# Patient Record
Sex: Female | Born: 1985 | Race: Black or African American | Hispanic: No | Marital: Single | Smoking: Never smoker
Health system: Southern US, Community
[De-identification: ages and names within clinical notes are randomized; demographics above are authoritative.]

---

## 2007-10-27 ENCOUNTER — Inpatient Hospital Stay (HOSPITAL_COMMUNITY): Admission: AD | Admit: 2007-10-27 | Discharge: 2007-11-01 | Payer: Self-pay | Admitting: Obstetrics and Gynecology

## 2007-10-29 ENCOUNTER — Encounter (INDEPENDENT_AMBULATORY_CARE_PROVIDER_SITE_OTHER): Payer: Self-pay | Admitting: Obstetrics and Gynecology

## 2010-06-20 NOTE — Op Note (Signed)
NAME:  Holly Mitchell, Holly Mitchell          ACCOUNT NO.:  [id number]   MEDICAL RECORD NO.:  [id number]          PATIENT TYPE:  INP   LOCATION:  9119                          FACILITY:  [facility]   PHYSICIAN:  Maxie Better, M.D.DATE OF BIRTH:  [birth year]   DATE OF PROCEDURE:  10/29/2007  DATE OF DISCHARGE:                               OPERATIVE REPORT   PREOPERATIVE DIAGNOSES:  1. Nonreassuring fetal tracing.  2. Arrest of descent.   PROCEDURE:  Primary cesarean section, Kerr  hysterotomy.   POSTOPERATIVE DIAGNOSES:  1. Nonreassuring fetal tracing.  2. Arrest of descent.   ANESTHESIA:  Epidural.   SURGEON:  Maxie Better, MD   ASSISTANT:  Lendon Colonel, MD   PROCEDURE:  Under adequate epidural anesthesia, the patient was placed  in the supine position with left lateral tilt.  An indwelling Foley  catheter was sterilely placed.  0.25% Marcaine was injected along the  planned Pfannenstiel skin incision site.  Pfannenstiel skin incision was  then made, carried down to the rectus fascia.  Rectus fascia was opened  transversely.  The rectus fascia was then bluntly and sharply dissected  off the rectus muscle in superior-to-inferior fashion.  Rectus muscles  split in the midline.  The parietal peritoneum was entered bluntly and  extended.  The vesicouterine peritoneum was opened transversely.  The  bladder was distended.  The bladder flap was then bluntly dissected off  the lower uterine segment, displaced inferiorly.  Curvilinear low-  transverse uterine incision was then made and extended with bandage  scissors.  Subsequent delivery with the assistance of the nurse pushing  the vertex up of a live female was accomplished.  Baby was bulb suctioned.  The abdomen cord was clamped and cut.  The baby was transferred to the  awaiting pediatrician who assigned Apgars of 8 and 9 at 1 and 5 minutes.  Placenta was manually removed.  Uterine cavity was cleaned of debris.  Uterine incision  had small extension on the right inferior about 2 cm.  This was closed separately with 0 Monocryl suture.  The remaining  incision was closed with 0 Monocryl in running-locked stitch.  Second  layer was imbricated using 0 Monocryl suture.  A reinforced figure-of-  eight suture was placed along the extension site which had noted to have  some bleeding.  Both tubes and ovaries were noted to be normal.  The  abdomen was copiously irrigated, suctioned of debris, and good  hemostasis noted.  The parietal peritoneum was closed with 2-0 Vicryl.  The rectus fascia was closed with 0 Vicryl x2.  The subcutaneous area  was irrigated, small bleeders cauterized.  Interrupted 2-0 plain suture  was placed.  Skin approximated using Ethicon staples.   SPECIMENS:  Placenta sent to pathology.   ESTIMATED BLOOD LOSS:  800 mL.   INTRAOPERATIVE FLUID:  1200 mL.   URINE OUTPUT:  300 mL clearing.   COUNTS:  Sponge and instrument counts x2.   COMPLICATIONS:  None.   Weight of the baby was 7 pounds 7 ounces.  Cord pH was 7.34.  The  patient tolerated the procedure well and was  transferred to the recovery  room in stable condition.      Maxie Better, M.D.  Electronically Signed     [place]/MEDQ  D:  10/29/2007  T:  10/30/2007  Job:  [id number]

## 2010-06-23 NOTE — Discharge Summary (Signed)
NAME:  Holly Mitchell, Holly Mitchell             ACCOUNT NO.:  [id number]   MEDICAL RECORD NO.:  [id number]          PATIENT TYPE:  INP   LOCATION:  9119                          FACILITY:  [facility]   PHYSICIAN:  Maxie Better, M.D.DATE OF BIRTH:  01-Jan-1986   DATE OF ADMISSION:  10/27/2007  DATE OF DISCHARGE:  11/01/2007                               DISCHARGE SUMMARY   ADMISSION DIAGNOSIS:  Postdates.   DISCHARGE DIAGNOSES:  Postdates delivered, nonreassuring fetal tracing,  arrest of descent.   PROCEDURE:  1. A primary cesarean section,  Kerr hysterotomy   HISTORY OF PRESENT ILLNESS:  A 25 year old, gravida 1, para 0, at 62+  weeks' gestation admitted for induction of labor.   HOSPITAL COURSE:  The patient was admitted.  She was long, closed, and  high.  She was given Cervidil ripening followed by Cytotec.  She  subsequently had cervical dilatation, type 2, 100% minus one.  Artificial rupture of membranes was performed at that time.  Clear fluid  was noted.  Epidural was given.  The patient subsequently had internal  scalp electrode and intrauterine pressure catheter placement.  The  patient subsequently had some variable deceleration where amnioinfusion  was ordered.  She then progressed to full dilatation, +1 station,  started pushing; however, the patient on re-examination was actually a  rim, +1 station, the pushing was stopped.  Contractions were 3-6  minutes.  Pitocin which had been discontinued during this process was  restarted.  Attempted labor in the vertex down.  The patient  subsequently started to push again.  She had repetitive late  deceleration with contractions and pushing and with a narrow pelvic arch  in the vertex still at +1 station.  The patient was not a candidate for  an assisted vaginal delivery.  She was taken to the operating room for  primary cesarean section.  At the time of cesarean section, she  delivered a live female, left occiput transverse position, Apgars  of 8 and  9, cord pH was 7.34, weight of baby was 7 pounds 7 ounces.  The patient  had an uncomplicated postoperative course.  Her CBC on postop day #1  showed a hemoglobin of 9.8, hematocrit 29.9, white count of 16.8,  platelet count of 174,000.  The patient has good parental support.  Incision showed no evidence of infection.  She was deemed well to be  discharged home.   DISPOSITION:  Home.   CONDITION:  Stable.   DISCHARGE MEDICATIONS:  1. Colace 1 tablet per mouth twice a day.  2. Percocet 5/325 mg 1-2 tablets every 4-6 hours p.r.n. pain.  3. Motrin 600 mg every 6 hours p.r.n. pain.  4. Prenatal vitamins 1 p.o. daily.   Followup appointment at [facility] in 6 weeks.   Discharge instructions per the postpartum booklet given.       Maxie Better, M.D.  Electronically Signed     [place]/MEDQ  D:  12/16/2007  T:  12/17/2007  Job:  [id number]

## 2010-11-06 LAB — CBC
HCT: 29.9 — ABNORMAL LOW
HCT: 35.2 — ABNORMAL LOW
Hemoglobin: 9.8 — ABNORMAL LOW
MCHC: 32.7
MCV: 86.3
RBC: 4.08
RDW: 14.4
WBC: 10

## 2018-07-11 LAB — OB RESULTS CONSOLE ABO/RH: RH Type: POSITIVE

## 2018-07-11 LAB — OB RESULTS CONSOLE RPR: RPR: NONREACTIVE

## 2018-07-11 LAB — OB RESULTS CONSOLE HIV ANTIBODY (ROUTINE TESTING): HIV: NONREACTIVE

## 2018-07-11 LAB — OB RESULTS CONSOLE RUBELLA ANTIBODY, IGM: Rubella: IMMUNE

## 2018-07-11 LAB — OB RESULTS CONSOLE HEPATITIS B SURFACE ANTIGEN: Hepatitis B Surface Ag: NEGATIVE

## 2019-01-09 ENCOUNTER — Inpatient Hospital Stay (HOSPITAL_COMMUNITY)
Admission: AD | Admit: 2019-01-09 | Discharge: 2019-01-17 | DRG: 788 | Disposition: A | Discharge status: Home / Self Care | Payer: BC Managed Care – PPO | Attending: Obstetrics and Gynecology | Admitting: Obstetrics and Gynecology

## 2019-01-09 ENCOUNTER — Encounter (HOSPITAL_COMMUNITY): Payer: Self-pay

## 2019-01-09 ENCOUNTER — Other Ambulatory Visit: Payer: Self-pay

## 2019-01-09 DIAGNOSIS — O114 Pre-existing hypertension with pre-eclampsia, complicating childbirth: Secondary | ICD-10-CM | POA: Diagnosis present

## 2019-01-09 DIAGNOSIS — O34219 Maternal care for unspecified type scar from previous cesarean delivery: Secondary | ICD-10-CM | POA: Diagnosis not present

## 2019-01-09 DIAGNOSIS — O321XX0 Maternal care for breech presentation, not applicable or unspecified: Secondary | ICD-10-CM | POA: Diagnosis present

## 2019-01-09 DIAGNOSIS — Z98891 History of uterine scar from previous surgery: Secondary | ICD-10-CM

## 2019-01-09 DIAGNOSIS — O1002 Pre-existing essential hypertension complicating childbirth: Secondary | ICD-10-CM | POA: Diagnosis present

## 2019-01-09 DIAGNOSIS — O34211 Maternal care for low transverse scar from previous cesarean delivery: Secondary | ICD-10-CM | POA: Diagnosis present

## 2019-01-09 DIAGNOSIS — O133 Gestational [pregnancy-induced] hypertension without significant proteinuria, third trimester: Secondary | ICD-10-CM | POA: Diagnosis present

## 2019-01-09 DIAGNOSIS — Z20828 Contact with and (suspected) exposure to other viral communicable diseases: Secondary | ICD-10-CM | POA: Diagnosis present

## 2019-01-09 DIAGNOSIS — Z363 Encounter for antenatal screening for malformations: Secondary | ICD-10-CM | POA: Diagnosis not present

## 2019-01-09 DIAGNOSIS — O169 Unspecified maternal hypertension, unspecified trimester: Secondary | ICD-10-CM

## 2019-01-09 DIAGNOSIS — O3413 Maternal care for benign tumor of corpus uteri, third trimester: Secondary | ICD-10-CM | POA: Diagnosis not present

## 2019-01-09 DIAGNOSIS — Z3A35 35 weeks gestation of pregnancy: Secondary | ICD-10-CM | POA: Diagnosis not present

## 2019-01-09 DIAGNOSIS — O10013 Pre-existing essential hypertension complicating pregnancy, third trimester: Secondary | ICD-10-CM | POA: Diagnosis not present

## 2019-01-09 LAB — CBC WITH DIFFERENTIAL/PLATELET
Abs Immature Granulocytes: 0.05 10*3/uL (ref 0.00–0.07)
Basophils Absolute: 0 10*3/uL (ref 0.0–0.1)
Basophils Relative: 0 %
Eosinophils Absolute: 0.1 10*3/uL (ref 0.0–0.5)
Eosinophils Relative: 1 %
HCT: 42.1 % (ref 36.0–46.0)
Hemoglobin: 14.3 g/dL (ref 12.0–15.0)
Immature Granulocytes: 1 %
Lymphocytes Relative: 16 %
Lymphs Abs: 1.8 10*3/uL (ref 0.7–4.0)
MCH: 32.1 pg (ref 26.0–34.0)
MCHC: 34 g/dL (ref 30.0–36.0)
MCV: 94.6 fL (ref 80.0–100.0)
Monocytes Absolute: 0.8 10*3/uL (ref 0.1–1.0)
Monocytes Relative: 7 %
Neutro Abs: 8.4 10*3/uL — ABNORMAL HIGH (ref 1.7–7.7)
Neutrophils Relative %: 75 %
Platelets: 184 10*3/uL (ref 150–400)
RBC: 4.45 MIL/uL (ref 3.87–5.11)
RDW: 13.4 % (ref 11.5–15.5)
WBC: 11.1 10*3/uL — ABNORMAL HIGH (ref 4.0–10.5)
nRBC: 0 % (ref 0.0–0.2)

## 2019-01-09 LAB — COMPREHENSIVE METABOLIC PANEL
ALT: 26 U/L (ref 0–44)
AST: 26 U/L (ref 15–41)
Albumin: 2.9 g/dL — ABNORMAL LOW (ref 3.5–5.0)
Alkaline Phosphatase: 132 U/L — ABNORMAL HIGH (ref 38–126)
Anion gap: 9 (ref 5–15)
BUN: 7 mg/dL (ref 6–20)
CO2: 21 mmol/L — ABNORMAL LOW (ref 22–32)
Calcium: 9.3 mg/dL (ref 8.9–10.3)
Chloride: 107 mmol/L (ref 98–111)
Creatinine, Ser: 0.5 mg/dL (ref 0.44–1.00)
GFR calc Af Amer: >60 mL/min (ref >60)
GFR calc non Af Amer: >60 mL/min (ref >60)
Glucose, Bld: 73 mg/dL (ref 70–99)
Potassium: 4.6 mmol/L (ref 3.5–5.1)
Sodium: 137 mmol/L (ref 135–145)
Total Bilirubin: 0.2 mg/dL — ABNORMAL LOW (ref 0.3–1.2)
Total Protein: 5.6 g/dL — ABNORMAL LOW (ref 6.5–8.1)

## 2019-01-09 LAB — TYPE AND SCREEN
ABO/RH(D): B POS
Antibody Screen: NEGATIVE

## 2019-01-09 LAB — PROTEIN / CREATININE RATIO, URINE
Creatinine, Urine: 152.92 mg/dL
Protein Creatinine Ratio: 0.12 mg/mg{Cre} (ref 0.00–0.15)
Total Protein, Urine: 19 mg/dL

## 2019-01-09 LAB — GROUP B STREP BY PCR: Group B strep by PCR: NEGATIVE

## 2019-01-09 LAB — ABO/RH: ABO/RH(D): B POS

## 2019-01-09 LAB — SARS CORONAVIRUS 2 (TAT 6-24 HRS): SARS Coronavirus 2: NEGATIVE

## 2019-01-09 MED ORDER — LACTATED RINGERS IV SOLN
INTRAVENOUS | Status: DC
  Administered 2019-01-10: 06:00:00 via INTRAVENOUS

## 2019-01-09 MED ORDER — LABETALOL HCL 5 MG/ML IV SOLN
20.0000 mg | INTRAVENOUS | Status: DC | PRN
  Administered 2019-01-09 – 2019-01-13 (×2): 20 mg via INTRAVENOUS
  Filled 2019-01-09 (×2): qty 4

## 2019-01-09 MED ORDER — MAGNESIUM SULFATE 40 GM/1000ML IV SOLN
2.0000 g/h | INTRAVENOUS | Status: AC
  Administered 2019-01-09 – 2019-01-10 (×2): 2 g/h via INTRAVENOUS
  Filled 2019-01-09 (×2): qty 1000

## 2019-01-09 MED ORDER — BETAMETHASONE SOD PHOS & ACET 6 (3-3) MG/ML IJ SUSP
12.0000 mg | INTRAMUSCULAR | Status: AC
  Administered 2019-01-09 – 2019-01-10 (×2): 12 mg via INTRAMUSCULAR
  Filled 2019-01-09 (×2): qty 5

## 2019-01-09 MED ORDER — LABETALOL HCL 5 MG/ML IV SOLN: 80.0000 mg | INTRAVENOUS | Status: DC | PRN

## 2019-01-09 MED ORDER — NIFEDIPINE 10 MG PO CAPS
10.0000 mg | ORAL_CAPSULE | Freq: Three times a day (TID) | ORAL | Status: DC
  Administered 2019-01-09 – 2019-01-10 (×5): 10 mg via ORAL
  Filled 2019-01-09 (×5): qty 1

## 2019-01-09 MED ORDER — HYDRALAZINE HCL 20 MG/ML IJ SOLN: 10.0000 mg | INTRAMUSCULAR | Status: DC | PRN

## 2019-01-09 MED ORDER — MAGNESIUM SULFATE BOLUS VIA INFUSION
4.0000 g | Freq: Once | INTRAVENOUS | Status: AC
  Administered 2019-01-09: 4 g via INTRAVENOUS
  Filled 2019-01-09: qty 1000

## 2019-01-09 MED ORDER — LABETALOL HCL 5 MG/ML IV SOLN
40.0000 mg | INTRAVENOUS | Status: DC | PRN
  Administered 2019-01-09: 40 mg via INTRAVENOUS
  Filled 2019-01-09: qty 8

## 2019-01-09 NOTE — H&P (Signed)
33 y.o. G3P1 at 35 1/7 weeks comes in for severe range BPs.  Pt has history of chronic hypertension in past but only had 2 140/90s at 20 weeks before 140/90 on this past Wednesday.  Pt today in office for short interval f/u had 170/100.  Admitted to [facility] for evaluation and has had three severe range BPs requiring labetalol protocol.  She c/o no preeclampsia sx and her labs were normal on Wednesday. Otherwise has good fetal movement and no bleeding.  PMH: hx of HTN and preeclapmsia with last pregnancy. History reviewed. No pertinent past medical history. History reviewed. No pertinent surgical history.  OB History  No obstetric history on file.    Social History   Socioeconomic History  . Marital status: Single    Spouse name: Not on file  . Number of children: Not on file  . Years of education: Not on file  . Highest education level: Not on file  Occupational History  . Not on file  Social Needs  . Financial resource strain: Not on file  . Food insecurity    Worry: Not on file    Inability: Not on file  . Transportation needs    Medical: Not on file    Non-medical: Not on file  Tobacco Use  . Smoking status: Never Smoker  . Smokeless tobacco: Never Used  Substance and Sexual Activity  . Alcohol use: Never    Frequency: Never  . Drug use: Never  . Sexual activity: Yes  Lifestyle  . Physical activity    Days per week: Not on file    Minutes per session: Not on file  . Stress: Not on file  Relationships  . Social Herbalist on phone: Not on file    Gets together: Not on file    Attends religious service: Not on file    Active member of club or organization: Not on file    Attends meetings of clubs or organizations: Not on file    Relationship status: Not on file  . Intimate partner violence    Fear of current or ex partner: Not on file    Emotionally abused: Not on file    Physically abused: Not on file    Forced sexual activity: Not on file  Other Topics  Concern  . Not on file  Social History Narrative  . Not on file   Patient has no allergy information on record.    Prenatal Transfer Tool  Maternal Diabetes: No Genetic Screening: Declined Maternal Ultrasounds/Referrals: Normal Fetal Ultrasounds or other Referrals:  None Maternal Substance Abuse:  No Significant Maternal Medications:  None Significant Maternal Lab Results: None  Other PNC: uncomplicated.    Vitals:   01/09/19 1100 01/09/19 1105 01/09/19 1112 01/09/19 1133  BP:  (!) 153/102 (!) 143/93 (!) 162/99  Pulse:  68 68 66  Resp:      Temp:      TempSrc:      Weight: 86.2 kg     Height: 5\' 4"  (1.626 m)       Lungs/Cor:  NAD Abdomen:  soft, gravid Ex:  no cords, erythema SVE:  P FHTs:  130s, good STV, NST R; Cat 1 tracing. Toco:  q occ   A/P   Severe range BPs new onset in 35 week pt with hx of CHTN but not treated during pregnancy.  Pt received 3 doses of labetalol with hypertensive protocol. D/w Dr. Gertie Exon the following plan: Admit for betamethasone  and magnesium sulfate. Add Procardia 10 mg IM TID for BP control. Deliver if still needing protocol meds above that- pt is for repeat c/s.  GBS not done yet.  Will order.  Loney Laurence

## 2019-01-09 NOTE — Progress Notes (Signed)
Pt admitted for elevated, severe range blood pressures which has required the labetalol protocol. IV started in left hand with 18 gauge angiocath. LR up @50cc /hour. Pt placed on EFM. FHR stable 150 with moderate variability. Pt denies having a headache, blurred vision, or right epigastric pain.   1215 4 gram magnesium bolus infusing 1235  Magnesium now running at 2 grams/hour  Procardia given as well as the first dose of BMZ  Will continue to monitor.   Toya Smothers, RN

## 2019-01-09 NOTE — MAU Note (Signed)
Covid swab collected. PT tolerated well.Pt asymptomatic 

## 2019-01-10 DIAGNOSIS — O133 Gestational [pregnancy-induced] hypertension without significant proteinuria, third trimester: Secondary | ICD-10-CM | POA: Diagnosis present

## 2019-01-10 MED ORDER — FAMOTIDINE 20 MG PO TABS
20.0000 mg | ORAL_TABLET | Freq: Two times a day (BID) | ORAL | Status: DC
  Administered 2019-01-10 – 2019-01-14 (×8): 20 mg via ORAL
  Filled 2019-01-10 (×8): qty 1

## 2019-01-10 NOTE — Progress Notes (Signed)
33 y.o. [id number] [redacted]w[redacted]d HD#1 admitted for 35WKS GESTIONAL HYPERTENSION.  Pt currently stable with no c/o preeclampsia symptoms.  Good FM.  Vitals:   01/10/19 0400 01/10/19 0603 01/10/19 0738 01/10/19 0800  BP: 135/82 (!) 148/92 140/77   Pulse: 69  73   Resp: 18  18 18   Temp: 97.9 F (36.6 C)  97.9 F (36.6 C)   TempSrc: Oral  Oral   SpO2: 99%  99%   Weight:      Height:        Lungs CTA Cor RRR Abd  Soft, gravid, nontender Ex SCDs FHTs  120s, good short term variability, NST R Toco  occ  Results for orders placed or performed during the hospital encounter of 01/09/19 (from the past 24 hour(s))  SARS CORONAVIRUS 2 (TAT 6-24 HRS) Nasopharyngeal Nasopharyngeal Swab     Status: None   Collection Time: 01/09/19 10:34 AM   Specimen: Nasopharyngeal Swab  Result Value Ref Range   SARS Coronavirus 2 NEGATIVE NEGATIVE  CBC with Differential/Platelet     Status: Abnormal   Collection Time: 01/09/19 11:00 AM  Result Value Ref Range   WBC 11.1 (H) 4.0 - 10.5 K/uL   RBC 4.45 3.87 - 5.11 MIL/uL   Hemoglobin 14.3 12.0 - 15.0 g/dL   HCT 42.1 36.0 - 46.0 %   MCV 94.6 80.0 - 100.0 fL   MCH 32.1 26.0 - 34.0 pg   MCHC 34.0 30.0 - 36.0 g/dL   RDW 13.4 11.5 - 15.5 %   Platelets 184 150 - 400 K/uL   nRBC 0.0 0.0 - 0.2 %   Neutrophils Relative % 75 %   Neutro Abs 8.4 (H) 1.7 - 7.7 K/uL   Lymphocytes Relative 16 %   Lymphs Abs 1.8 0.7 - 4.0 K/uL   Monocytes Relative 7 %   Monocytes Absolute 0.8 0.1 - 1.0 K/uL   Eosinophils Relative 1 %   Eosinophils Absolute 0.1 0.0 - 0.5 K/uL   Basophils Relative 0 %   Basophils Absolute 0.0 0.0 - 0.1 K/uL   Immature Granulocytes 1 %   Abs Immature Granulocytes 0.05 0.00 - 0.07 K/uL  Type and screen [place]     Status: None   Collection Time: 01/09/19 11:00 AM  Result Value Ref Range   ABO/RH(D) B POS    Antibody Screen NEG    Sample Expiration      01/12/2019,2359 Performed at [facility], [address],  [place], [place] [address]   Comprehensive metabolic panel     Status: Abnormal   Collection Time: 01/09/19 11:00 AM  Result Value Ref Range   Sodium 137 135 - 145 mmol/L   Potassium 4.6 3.5 - 5.1 mmol/L   Chloride 107 98 - 111 mmol/L   CO2 21 (L) 22 - 32 mmol/L   Glucose, Bld 73 70 - 99 mg/dL   BUN 7 6 - 20 mg/dL   Creatinine, Ser 0.50 0.44 - 1.00 mg/dL   Calcium 9.3 8.9 - 10.3 mg/dL   Total Protein 5.6 (L) 6.5 - 8.1 g/dL   Albumin 2.9 (L) 3.5 - 5.0 g/dL   AST 26 15 - 41 U/L   ALT 26 0 - 44 U/L   Alkaline Phosphatase 132 (H) 38 - 126 U/L   Total Bilirubin 0.2 (L) 0.3 - 1.2 mg/dL   GFR calc non Af Amer >60 >60 mL/min   GFR calc Af Amer >60 >60 mL/min   Anion gap 9  5 - 15  ABO/Rh     Status: None   Collection Time: 01/09/19 11:00 AM  Result Value Ref Range   ABO/RH(D)      B POS Performed at [facility], [address], [place], [place] [address]   Protein / creatinine ratio, urine     Status: None   Collection Time: 01/09/19 12:25 PM  Result Value Ref Range   Creatinine, Urine 152.92 mg/dL   Total Protein, Urine 19 mg/dL   Protein Creatinine Ratio 0.12 0.00 - 0.15 mg/mg[Cre]  Group B strep by PCR     Status: None   Collection Time: 01/09/19 12:25 PM   Specimen: Vaginal/Rectal; Genital  Result Value Ref Range   Group B strep by PCR NEGATIVE NEGATIVE    A:  HD#1  [redacted]w[redacted]d with gestational hypertension, severe range yesterday and without any other s/s of severe. GBS neg.  Labs normal yesterday.   P: 1.  BPs controlled yesterday on Procardia 10 mg IR and magensium sulfate.  Will stop magenesium sulfate at 24 hours.  Will consider changing pt to Procardia 30 mg XL tomorrow if BP stable overnight. 2.  BMZ second dose due this afternoon.   3.  Pt is for repeat C/S. 4.  Can go to intermittent monitoring.  5.  D/w Dr. Grace Bushy plan- if  Pt's BPS are not controlled by current regimen and continue to be severe range, will deliver. If Pt stays stable, will continue to monitor pt  until 37 weeks.  In that case, we should do growth scan, AFI and BPP on Monday.  Will need to discuss at that time whether pt should be in house or can go home.   Loney Laurence

## 2019-01-11 MED ORDER — NIFEDIPINE ER OSMOTIC RELEASE 30 MG PO TB24
30.0000 mg | ORAL_TABLET | Freq: Every day | ORAL | Status: DC
  Administered 2019-01-11 – 2019-01-13 (×3): 30 mg via ORAL
  Filled 2019-01-11 (×3): qty 1

## 2019-01-11 NOTE — Progress Notes (Signed)
33 y.o. [id number] [redacted]w[redacted]d HD#2 admitted for 35WKS GESTIONAL HYPERTENSION.  Pt currently stable with no c/o today.  Pt had heartburn yesterday and is better after Pepcid.  Good FM.  Last BP slightly elevated but just switching now to Procardia XL.   Vitals:   01/10/19 2217 01/10/19 2320 01/11/19 0500 01/11/19 0744  BP: (!) 143/89 (!) 144/86 115/62 (!) 155/83  Pulse:  65 66 74  Resp:  18 17 18   Temp:  98 F (36.7 C) 98.3 F (36.8 C) 98.3 F (36.8 C)  TempSrc:  Oral Oral Oral  SpO2:  99% 99% 99%  Weight:      Height:        Lungs CTA Cor RRR Abd  Soft, gravid, nontender Ex SCDs FHTs Last night 120s, good short term variability, NST R Toco  None.  No results found for this or any previous visit (from the past 24 hour(s)).  A:  HD#2  [redacted]w[redacted]d with GHTN with initially severe range BPs, now off magnesium sulfate and switching to procardia 30 mg XL.  P: 1.  D/w Dr. Gertie Exon plan- if  Pt's BPS are not controlled by current regimen and continue to be severe range, will deliver. If Pt stays stable, will continue to monitor pt until 37 weeks. In that case, we should do growth scan, AFI and BPP on Monday.  Will need to discuss at that time whether pt should be in house or can go home.  2. S/p BMZ and magnesium sulfate. 3.  Pt is for repeat C/S.  Daria Pastures

## 2019-01-12 ENCOUNTER — Inpatient Hospital Stay (HOSPITAL_COMMUNITY): Payer: BC Managed Care – PPO

## 2019-01-12 DIAGNOSIS — O10013 Pre-existing essential hypertension complicating pregnancy, third trimester: Secondary | ICD-10-CM

## 2019-01-12 DIAGNOSIS — Z363 Encounter for antenatal screening for malformations: Secondary | ICD-10-CM | POA: Diagnosis not present

## 2019-01-12 DIAGNOSIS — Z3A35 35 weeks gestation of pregnancy: Secondary | ICD-10-CM | POA: Diagnosis not present

## 2019-01-12 DIAGNOSIS — O34219 Maternal care for unspecified type scar from previous cesarean delivery: Secondary | ICD-10-CM | POA: Diagnosis not present

## 2019-01-12 DIAGNOSIS — O3413 Maternal care for benign tumor of corpus uteri, third trimester: Secondary | ICD-10-CM

## 2019-01-12 LAB — TYPE AND SCREEN
ABO/RH(D): B POS
Antibody Screen: NEGATIVE

## 2019-01-12 NOTE — Progress Notes (Addendum)
Holly Mitchell 33 y.o. G3P1011 at [redacted]w[redacted]d HD#4 admitted with severe BPs S: Reports no contractions, LOF, VB. Normal FM. Denies HA/VC/RUQ pain/SOB O: Vitals:   01/11/19 2341 01/12/19 0614 01/12/19 0618 01/12/19 0751  BP: 129/66 (!) 173/94 140/81 (!) 157/94  Pulse: 64 71 71 66  Resp: 16  17 16   Temp: 98.4 F (36.9 C)  98.4 F (36.9 C) 98.2 F (36.8 C)  TempSrc: Oral  Oral Oral  SpO2: 99%  100% 100%  Weight:      Height:        NST FHR 145, +accels, No decels.  Toco quiet  A/p: Holly Mitchell 33 y.o. G3P1011 at [redacted]w[redacted]d Hd#4 with CHTN, severe Bps, now improved 1.  CHTN: on no meds, admitted 12/4 with severe range Bps, required IV Lab 20, 40 12/4, was then on procardia 10mg  TID IR, now on procardia 30mg  XL 2. S/p BMZ 12/4-5 and magnesium sulfate 12/4-5 3.  Pt is for repeat C/S.  Plan pending [place] today. Discussed with Dr. Annamaria Boots (MFM), he recommends 24h urine, watch Bps today, possible discharge tomorrow if US wnl and 24h urine rules out preeclampsia. If discharged plan for 2x week testing until delivery at 37w.  Holly Mitchell K Holly Mitchell 01/12/19 10:28 AM

## 2019-01-13 LAB — COMPREHENSIVE METABOLIC PANEL
ALT: 24 U/L (ref 0–44)
AST: 19 U/L (ref 15–41)
Albumin: 3 g/dL — ABNORMAL LOW (ref 3.5–5.0)
Alkaline Phosphatase: 136 U/L — ABNORMAL HIGH (ref 38–126)
Anion gap: 9 (ref 5–15)
BUN: 10 mg/dL (ref 6–20)
CO2: 21 mmol/L — ABNORMAL LOW (ref 22–32)
Calcium: 9.1 mg/dL (ref 8.9–10.3)
Chloride: 104 mmol/L (ref 98–111)
Creatinine, Ser: 0.46 mg/dL (ref 0.44–1.00)
GFR calc Af Amer: >60 mL/min (ref >60)
GFR calc non Af Amer: >60 mL/min (ref >60)
Glucose, Bld: 92 mg/dL (ref 70–99)
Potassium: 3.9 mmol/L (ref 3.5–5.1)
Sodium: 134 mmol/L — ABNORMAL LOW (ref 135–145)
Total Bilirubin: 0.3 mg/dL (ref 0.3–1.2)
Total Protein: 6.1 g/dL — ABNORMAL LOW (ref 6.5–8.1)

## 2019-01-13 LAB — CBC
HCT: 42.6 % (ref 36.0–46.0)
Hemoglobin: 14.7 g/dL (ref 12.0–15.0)
MCH: 32.1 pg (ref 26.0–34.0)
MCHC: 34.5 g/dL (ref 30.0–36.0)
MCV: 93 fL (ref 80.0–100.0)
Platelets: 194 10*3/uL (ref 150–400)
RBC: 4.58 MIL/uL (ref 3.87–5.11)
RDW: 13.2 % (ref 11.5–15.5)
WBC: 13.1 10*3/uL — ABNORMAL HIGH (ref 4.0–10.5)
nRBC: 0 % (ref 0.0–0.2)

## 2019-01-13 LAB — PROTEIN, URINE, 24 HOUR
Collection Interval-UPROT: 24 hours
Protein, 24H Urine: 170 mg/d — ABNORMAL HIGH (ref 50–100)
Protein, Urine: 10 mg/dL
Urine Total Volume-UPROT: 1700 mL

## 2019-01-13 LAB — CREATININE, URINE, 24 HOUR
Collection Interval-UCRE24: 24 hours
Creatinine, 24H Ur: 1323 mg/d (ref 600–1800)
Creatinine, Urine: 77.82 mg/dL
Urine Total Volume-UCRE24: 1700 mL

## 2019-01-13 LAB — PROTEIN / CREATININE RATIO, URINE
Creatinine, Urine: 146.26 mg/dL
Protein Creatinine Ratio: 0.12 mg/mg{Cre} (ref 0.00–0.15)
Total Protein, Urine: 18 mg/dL

## 2019-01-13 NOTE — Progress Notes (Signed)
Pt had a severe range b/p earlier today. She was given labetalol. B/ps continue to be significantly elevated.Discussed pt with MFM. Will proceed with repeat c/s tomorrow at 36 wks. Repeat [place] labs  [place]. No headache or vision changes IMp/ IUP at 36 with chronic htn with superimposed preeclampsia. Severe range b/p today while on procardia. Plan/ Will make NPO after midnight and schedule c/s tomorrow.

## 2019-01-13 NOTE — Progress Notes (Signed)
Pt has not completed the 24 hour protein collection yet. She states that she is doing well. Good FM.  B/P- Had severe range B/P this am x 1.  IMP/ HTN in preg.  Plan/ Will await results of 24 hour urine.           Will change HTN med dosing if more elevated b/ps occur.

## 2019-01-14 ENCOUNTER — Inpatient Hospital Stay (HOSPITAL_COMMUNITY): Payer: BC Managed Care – PPO | Admitting: Anesthesiology

## 2019-01-14 ENCOUNTER — Encounter (HOSPITAL_COMMUNITY)
Admission: AD | Disposition: A | Discharge status: Home / Self Care | Payer: Self-pay | Source: Home / Self Care | Attending: Obstetrics and Gynecology

## 2019-01-14 ENCOUNTER — Encounter (HOSPITAL_COMMUNITY): Payer: Self-pay | Admitting: Anesthesiology

## 2019-01-14 LAB — COMPREHENSIVE METABOLIC PANEL
ALT: 21 U/L (ref 0–44)
AST: 18 U/L (ref 15–41)
Albumin: 2.7 g/dL — ABNORMAL LOW (ref 3.5–5.0)
Alkaline Phosphatase: 130 U/L — ABNORMAL HIGH (ref 38–126)
Anion gap: 9 (ref 5–15)
BUN: 7 mg/dL (ref 6–20)
CO2: 22 mmol/L (ref 22–32)
Calcium: 9.1 mg/dL (ref 8.9–10.3)
Chloride: 106 mmol/L (ref 98–111)
Creatinine, Ser: 0.56 mg/dL (ref 0.44–1.00)
GFR calc Af Amer: >60 mL/min (ref >60)
GFR calc non Af Amer: >60 mL/min (ref >60)
Glucose, Bld: 85 mg/dL (ref 70–99)
Potassium: 4 mmol/L (ref 3.5–5.1)
Sodium: 137 mmol/L (ref 135–145)
Total Bilirubin: 0.4 mg/dL (ref 0.3–1.2)
Total Protein: 5.9 g/dL — ABNORMAL LOW (ref 6.5–8.1)

## 2019-01-14 LAB — CBC
HCT: 40.2 % (ref 36.0–46.0)
Hemoglobin: 14.1 g/dL (ref 12.0–15.0)
MCH: 32.3 pg (ref 26.0–34.0)
MCHC: 35.1 g/dL (ref 30.0–36.0)
MCV: 92 fL (ref 80.0–100.0)
Platelets: 187 10*3/uL (ref 150–400)
RBC: 4.37 MIL/uL (ref 3.87–5.11)
RDW: 12.8 % (ref 11.5–15.5)
WBC: 13.1 10*3/uL — ABNORMAL HIGH (ref 4.0–10.5)
nRBC: 0 % (ref 0.0–0.2)

## 2019-01-14 SURGERY — Surgical Case
Anesthesia: Spinal | Site: Abdomen | Wound class: Clean Contaminated

## 2019-01-14 MED ORDER — COCONUT OIL OIL
1.0000 "application " | TOPICAL_OIL | Status: DC | PRN
  Administered 2019-01-17: 1 via TOPICAL

## 2019-01-14 MED ORDER — LACTATED RINGERS IV SOLN
INTRAVENOUS | Status: DC
  Administered 2019-01-14: 13:00:00 via INTRAVENOUS

## 2019-01-14 MED ORDER — ACETAMINOPHEN 10 MG/ML IV SOLN
1000.0000 mg | Freq: Once | INTRAVENOUS | Status: DC | PRN
  Administered 2019-01-14: 1000 mg via INTRAVENOUS

## 2019-01-14 MED ORDER — PRENATAL MULTIVITAMIN CH
1.0000 | ORAL_TABLET | Freq: Every day | ORAL | Status: DC
  Administered 2019-01-15 – 2019-01-16 (×2): 1 via ORAL
  Filled 2019-01-14 (×2): qty 1

## 2019-01-14 MED ORDER — NALBUPHINE HCL 10 MG/ML IJ SOLN: 5.0000 mg | Freq: Once | INTRAMUSCULAR | Status: DC | PRN

## 2019-01-14 MED ORDER — NALBUPHINE HCL 10 MG/ML IJ SOLN
5.0000 mg | INTRAMUSCULAR | Status: DC | PRN
  Administered 2019-01-14: 5 mg via INTRAVENOUS

## 2019-01-14 MED ORDER — NALOXONE HCL 0.4 MG/ML IJ SOLN: 0.4000 mg | INTRAMUSCULAR | Status: DC | PRN

## 2019-01-14 MED ORDER — NALBUPHINE HCL 10 MG/ML IJ SOLN: 5.0000 mg | INTRAMUSCULAR | Status: DC | PRN

## 2019-01-14 MED ORDER — SODIUM CHLORIDE 0.9 % IV SOLN
INTRAVENOUS | Status: DC | PRN
  Administered 2019-01-14: 13:00:00 via INTRAVENOUS

## 2019-01-14 MED ORDER — MORPHINE SULFATE (PF) 0.5 MG/ML IJ SOLN
INTRAMUSCULAR | Status: AC
  Filled 2019-01-14: qty 10

## 2019-01-14 MED ORDER — ACETAMINOPHEN 10 MG/ML IV SOLN
INTRAVENOUS | Status: AC
  Filled 2019-01-14: qty 100

## 2019-01-14 MED ORDER — DIPHENHYDRAMINE HCL 25 MG PO CAPS: 25.0000 mg | ORAL_CAPSULE | ORAL | Status: DC | PRN

## 2019-01-14 MED ORDER — DIPHENHYDRAMINE HCL 50 MG/ML IJ SOLN: 12.5000 mg | INTRAMUSCULAR | Status: DC | PRN

## 2019-01-14 MED ORDER — OXYTOCIN 40 UNITS IN NORMAL SALINE INFUSION - SIMPLE MED: 2.5000 [IU]/h | INTRAVENOUS | Status: AC

## 2019-01-14 MED ORDER — PHENYLEPHRINE HCL-NACL 20-0.9 MG/250ML-% IV SOLN
INTRAVENOUS | Status: AC
  Filled 2019-01-14: qty 250

## 2019-01-14 MED ORDER — WITCH HAZEL-GLYCERIN EX PADS: 1.0000 "application " | MEDICATED_PAD | CUTANEOUS | Status: DC | PRN

## 2019-01-14 MED ORDER — SODIUM CHLORIDE 0.9 % IR SOLN
Status: DC | PRN
  Administered 2019-01-14: 1000 mL

## 2019-01-14 MED ORDER — MEPERIDINE HCL 25 MG/ML IJ SOLN: 6.2500 mg | INTRAMUSCULAR | Status: DC | PRN

## 2019-01-14 MED ORDER — NALBUPHINE HCL 10 MG/ML IJ SOLN
5.0000 mg | Freq: Once | INTRAMUSCULAR | Status: DC | PRN
  Filled 2019-01-14: qty 1

## 2019-01-14 MED ORDER — ACETAMINOPHEN 160 MG/5ML PO SOLN: 325.0000 mg | Freq: Once | ORAL | Status: DC | PRN

## 2019-01-14 MED ORDER — ACETAMINOPHEN 500 MG PO TABS
1000.0000 mg | ORAL_TABLET | Freq: Four times a day (QID) | ORAL | Status: DC
  Administered 2019-01-14 – 2019-01-17 (×9): 1000 mg via ORAL
  Filled 2019-01-14 (×10): qty 2

## 2019-01-14 MED ORDER — PROMETHAZINE HCL 25 MG/ML IJ SOLN: 6.2500 mg | INTRAMUSCULAR | Status: DC | PRN

## 2019-01-14 MED ORDER — MAGNESIUM SULFATE BOLUS VIA INFUSION
4.0000 g | Freq: Once | INTRAVENOUS | Status: AC
  Administered 2019-01-14: 4 g via INTRAVENOUS
  Filled 2019-01-14: qty 1000

## 2019-01-14 MED ORDER — LABETALOL HCL 5 MG/ML IV SOLN: 80.0000 mg | INTRAVENOUS | Status: DC | PRN

## 2019-01-14 MED ORDER — MAGNESIUM SULFATE 40 GM/1000ML IV SOLN
2.0000 g/h | INTRAVENOUS | Status: AC
  Administered 2019-01-15: 2 g/h via INTRAVENOUS
  Filled 2019-01-14: qty 1000

## 2019-01-14 MED ORDER — SENNOSIDES-DOCUSATE SODIUM 8.6-50 MG PO TABS
2.0000 | ORAL_TABLET | ORAL | Status: DC
  Administered 2019-01-14 – 2019-01-16 (×3): 2 via ORAL
  Filled 2019-01-14 (×3): qty 2

## 2019-01-14 MED ORDER — NIFEDIPINE ER OSMOTIC RELEASE 30 MG PO TB24
60.0000 mg | ORAL_TABLET | Freq: Every day | ORAL | Status: DC
  Administered 2019-01-15 – 2019-01-17 (×3): 60 mg via ORAL
  Filled 2019-01-14 (×3): qty 2

## 2019-01-14 MED ORDER — KETOROLAC TROMETHAMINE 30 MG/ML IJ SOLN
INTRAMUSCULAR | Status: AC
  Filled 2019-01-14: qty 1

## 2019-01-14 MED ORDER — ACETAMINOPHEN 325 MG PO TABS: 325.0000 mg | ORAL_TABLET | Freq: Once | ORAL | Status: DC | PRN

## 2019-01-14 MED ORDER — ONDANSETRON HCL 4 MG/2ML IJ SOLN
INTRAMUSCULAR | Status: AC
  Filled 2019-01-14: qty 2

## 2019-01-14 MED ORDER — SIMETHICONE 80 MG PO CHEW
80.0000 mg | CHEWABLE_TABLET | ORAL | Status: DC
  Administered 2019-01-14 – 2019-01-16 (×3): 80 mg via ORAL
  Filled 2019-01-14 (×3): qty 1

## 2019-01-14 MED ORDER — SODIUM CHLORIDE 0.9% FLUSH
3.0000 mL | INTRAVENOUS | Status: DC | PRN
  Administered 2019-01-15: 3 mL via INTRAVENOUS

## 2019-01-14 MED ORDER — OXYTOCIN 40 UNITS IN NORMAL SALINE INFUSION - SIMPLE MED
INTRAVENOUS | Status: DC | PRN
  Administered 2019-01-14: 40 [IU] via INTRAVENOUS

## 2019-01-14 MED ORDER — KETOROLAC TROMETHAMINE 30 MG/ML IJ SOLN
30.0000 mg | Freq: Four times a day (QID) | INTRAMUSCULAR | Status: AC | PRN
  Administered 2019-01-14: 30 mg via INTRAVENOUS

## 2019-01-14 MED ORDER — OXYCODONE HCL 5 MG PO TABS: 5.0000 mg | ORAL_TABLET | ORAL | Status: DC | PRN

## 2019-01-14 MED ORDER — MENTHOL 3 MG MT LOZG: 1.0000 | LOZENGE | OROMUCOSAL | Status: DC | PRN

## 2019-01-14 MED ORDER — SOD CITRATE-CITRIC ACID 500-334 MG/5ML PO SOLN
30.0000 mL | ORAL | Status: AC
  Administered 2019-01-14: 30 mL via ORAL
  Filled 2019-01-14: qty 30

## 2019-01-14 MED ORDER — STERILE WATER FOR IRRIGATION IR SOLN
Status: DC | PRN
  Administered 2019-01-14: 1000 mL

## 2019-01-14 MED ORDER — OXYTOCIN 40 UNITS IN NORMAL SALINE INFUSION - SIMPLE MED
INTRAVENOUS | Status: AC
  Filled 2019-01-14: qty 1000

## 2019-01-14 MED ORDER — ONDANSETRON HCL 4 MG/2ML IJ SOLN
INTRAMUSCULAR | Status: DC | PRN
  Administered 2019-01-14: 4 mg via INTRAVENOUS

## 2019-01-14 MED ORDER — ONDANSETRON HCL 4 MG/2ML IJ SOLN
4.0000 mg | Freq: Three times a day (TID) | INTRAMUSCULAR | Status: DC | PRN
  Administered 2019-01-14: 4 mg via INTRAVENOUS
  Filled 2019-01-14: qty 2

## 2019-01-14 MED ORDER — SIMETHICONE 80 MG PO CHEW
80.0000 mg | CHEWABLE_TABLET | Freq: Three times a day (TID) | ORAL | Status: DC
  Administered 2019-01-15 – 2019-01-17 (×7): 80 mg via ORAL
  Filled 2019-01-14 (×7): qty 1

## 2019-01-14 MED ORDER — FENTANYL CITRATE (PF) 100 MCG/2ML IJ SOLN: 25.0000 ug | INTRAMUSCULAR | Status: DC | PRN

## 2019-01-14 MED ORDER — LABETALOL HCL 5 MG/ML IV SOLN: 40.0000 mg | INTRAVENOUS | Status: DC | PRN

## 2019-01-14 MED ORDER — NIFEDIPINE ER OSMOTIC RELEASE 30 MG PO TB24
60.0000 mg | ORAL_TABLET | Freq: Every day | ORAL | Status: DC
  Administered 2019-01-14: 60 mg via ORAL
  Filled 2019-01-14: qty 2

## 2019-01-14 MED ORDER — LACTATED RINGERS IV SOLN: INTRAVENOUS | Status: DC

## 2019-01-14 MED ORDER — SIMETHICONE 80 MG PO CHEW
80.0000 mg | CHEWABLE_TABLET | ORAL | Status: DC | PRN
  Filled 2019-01-14: qty 1

## 2019-01-14 MED ORDER — MORPHINE SULFATE (PF) 0.5 MG/ML IJ SOLN
INTRAMUSCULAR | Status: DC | PRN
  Administered 2019-01-14: .15 mg via INTRATHECAL

## 2019-01-14 MED ORDER — FENTANYL CITRATE (PF) 100 MCG/2ML IJ SOLN
INTRAMUSCULAR | Status: DC | PRN
  Administered 2019-01-14: 15 ug via INTRATHECAL

## 2019-01-14 MED ORDER — DIPHENHYDRAMINE HCL 25 MG PO CAPS: 25.0000 mg | ORAL_CAPSULE | Freq: Four times a day (QID) | ORAL | Status: DC | PRN

## 2019-01-14 MED ORDER — DIBUCAINE (PERIANAL) 1 % EX OINT: 1.0000 "application " | TOPICAL_OINTMENT | CUTANEOUS | Status: DC | PRN

## 2019-01-14 MED ORDER — LACTATED RINGERS IV SOLN
INTRAVENOUS | Status: DC
  Administered 2019-01-14: 18:00:00 via INTRAVENOUS

## 2019-01-14 MED ORDER — TETANUS-DIPHTH-ACELL PERTUSSIS 5-2.5-18.5 LF-MCG/0.5 IM SUSP
0.5000 mL | Freq: Once | INTRAMUSCULAR | Status: DC
  Filled 2019-01-14: qty 0.5

## 2019-01-14 MED ORDER — FENTANYL CITRATE (PF) 100 MCG/2ML IJ SOLN
INTRAMUSCULAR | Status: AC
  Filled 2019-01-14: qty 2

## 2019-01-14 MED ORDER — SCOPOLAMINE 1 MG/3DAYS TD PT72: 1.0000 | MEDICATED_PATCH | Freq: Once | TRANSDERMAL | Status: DC

## 2019-01-14 MED ORDER — NALOXONE HCL 4 MG/10ML IJ SOLN
1.0000 ug/kg/h | INTRAVENOUS | Status: DC | PRN
  Filled 2019-01-14: qty 5

## 2019-01-14 MED ORDER — CEFAZOLIN SODIUM-DEXTROSE 2-4 GM/100ML-% IV SOLN
2.0000 g | INTRAVENOUS | Status: AC
  Administered 2019-01-14: 13:00:00 2 g via INTRAVENOUS

## 2019-01-14 MED ORDER — MAGNESIUM SULFATE 40 GM/1000ML IV SOLN
INTRAVENOUS | Status: AC
  Filled 2019-01-14: qty 1000

## 2019-01-14 MED ORDER — PHENYLEPHRINE HCL-NACL 20-0.9 MG/250ML-% IV SOLN
INTRAVENOUS | Status: DC | PRN
  Administered 2019-01-14: 60 ug/min via INTRAVENOUS

## 2019-01-14 MED ORDER — HYDRALAZINE HCL 20 MG/ML IJ SOLN: 10.0000 mg | INTRAMUSCULAR | Status: DC | PRN

## 2019-01-14 MED ORDER — LABETALOL HCL 5 MG/ML IV SOLN: 20.0000 mg | INTRAVENOUS | Status: DC | PRN

## 2019-01-14 MED ORDER — IBUPROFEN 800 MG PO TABS
800.0000 mg | ORAL_TABLET | Freq: Three times a day (TID) | ORAL | Status: DC
  Administered 2019-01-14 – 2019-01-17 (×7): 800 mg via ORAL
  Filled 2019-01-14 (×7): qty 1

## 2019-01-14 MED ORDER — BUPIVACAINE IN DEXTROSE 0.75-8.25 % IT SOLN
INTRATHECAL | Status: DC | PRN
  Administered 2019-01-14: 1.6 mL via INTRATHECAL

## 2019-01-14 MED ORDER — KETOROLAC TROMETHAMINE 30 MG/ML IJ SOLN: 30.0000 mg | Freq: Four times a day (QID) | INTRAMUSCULAR | Status: AC | PRN

## 2019-01-14 SURGICAL SUPPLY — 30 items
BENZOIN TINCTURE PRP APPL 2/3 (GAUZE/BANDAGES/DRESSINGS) ×3 IMPLANT
CHLORAPREP W/TINT 26ML (MISCELLANEOUS) ×3 IMPLANT
CLAMP CORD UMBIL (MISCELLANEOUS) ×3 IMPLANT
CLOSURE WOUND 1/2 X4 (GAUZE/BANDAGES/DRESSINGS) ×1
CLOTH BEACON ORANGE TIMEOUT ST (SAFETY) ×3 IMPLANT
DRSG OPSITE POSTOP 4X10 (GAUZE/BANDAGES/DRESSINGS) ×3 IMPLANT
ELECT REM PT RETURN 9FT ADLT (ELECTROSURGICAL) ×3
ELECTRODE REM PT RTRN 9FT ADLT (ELECTROSURGICAL) ×1 IMPLANT
GLOVE BIOGEL PI IND STRL 6.5 (GLOVE) ×1 IMPLANT
GLOVE BIOGEL PI IND STRL 7.0 (GLOVE) ×1 IMPLANT
GLOVE BIOGEL PI INDICATOR 6.5 (GLOVE) ×2
GLOVE BIOGEL PI INDICATOR 7.0 (GLOVE) ×2
GLOVE ECLIPSE 6.0 STRL STRAW (GLOVE) ×3 IMPLANT
GOWN STRL REUS W/TWL LRG LVL3 (GOWN DISPOSABLE) ×6 IMPLANT
NS IRRIG 1000ML POUR BTL (IV SOLUTION) ×3 IMPLANT
PACK C SECTION WH (CUSTOM PROCEDURE TRAY) ×3 IMPLANT
PAD OB MATERNITY 4.3X12.25 (PERSONAL CARE ITEMS) ×3 IMPLANT
PENCIL SMOKE EVAC W/HOLSTER (ELECTROSURGICAL) ×3 IMPLANT
RTRCTR C-SECT PINK 25CM LRG (MISCELLANEOUS) ×3 IMPLANT
STRIP CLOSURE SKIN 1/2X4 (GAUZE/BANDAGES/DRESSINGS) ×2 IMPLANT
SUT MNCRL 0 VIOLET CTX 36 (SUTURE) ×2 IMPLANT
SUT MNCRL AB 3-0 PS2 27 (SUTURE) ×3 IMPLANT
SUT MONOCRYL 0 CTX 36 (SUTURE) ×4
SUT VIC AB 0 CTX 36 (SUTURE) ×6
SUT VIC AB 0 CTX36XBRD ANBCTRL (SUTURE) ×3 IMPLANT
SUT VIC AB 2-0 CT1 27 (SUTURE) ×4
SUT VIC AB 2-0 CT1 TAPERPNT 27 (SUTURE) ×2 IMPLANT
TOWEL OR 17X24 6PK STRL BLUE (TOWEL DISPOSABLE) ×3 IMPLANT
TRAY FOLEY W/BAG SLVR 14FR LF (SET/KITS/TRAYS/PACK) ×3 IMPLANT
WATER STERILE IRR 1000ML POUR (IV SOLUTION) ×3 IMPLANT

## 2019-01-14 NOTE — Op Note (Signed)
Procedure(s): CESAREAN SECTION Procedure Note  Holly Mitchell female 33 y.o. 01/14/2019  Procedure(s) and Anesthesia Type:    * CESAREAN SECTION - Regional  Surgeon(s) and Role:    * Taam-Akelman, Lawrence Santiago, MD - Primary       Olga Millers, MD - Assisting  Indications: [name] 33 y.o. [id number] [redacted]w[redacted]d with superimposed preeclampsia by severe Bps, prior cesarean section     Surgeon: Jonelle Sidle   Anesthesia: Spinal  Procedure Detail  CESAREAN SECTION  Findings: Normal mullerian anatomy.  Moderate adhesions from anterior wall to uterus. Viable female infant with weight 2405g (5pounds 4.8ounces).  Estimated Blood Loss:  208cc         Specimens: Placenta to pathology         Complications:  None         Disposition: PACU - hemodynamically stable.         Condition: stable    Description of Procedure: The patient was taken to the operating room where epidural anesthesia was found to be adequate.  The patient was placed in the dorsal supine position.  Fetal heart tones were confirmed.  The patient was subsequently prepped and draped in the normal sterile fashion.    A low transverse skin incision was made with a scalpel and carried down to the level of the fascia with the Bovie.  The fascia was incised in the midline with the scalpel and extended laterally with curved Mayo scissors.  Kocher clamps were applied to the superior fascial edge and the fascia was dissected off the rectus muscle sharply using the scalpel.  The Kocher clamps were transferred to the inferior fascial edge and the underlying rectus muscle was dissected off with curved Mayo's scissors.  The rectus muscles then were separated in the midline.  The peritoneum was found free of adherent bowel and the peritoneal cavity was entered sharply.  The uterus was identified, noted to have an adhesive band to the anterior wall which was taken down sharply and a bladder flap was then created sharply with  Metzenbaum scissors and separated from the lower uterine segment digitally. The alexis retractor was placed intraperitoneal.    A low transverse hysterotomy was then made with a scalpel.  The infant was found in the breech presentation was delivered atraumatically and without difficulty in the usual fashion.  After 60 seconds of delayed cord clamping the cord was clamped and cut and the infant was handed off to the pediatricians.  The placenta was delivered with gentle traction on umbilical cord and manual massage of the uterine fundus.  The uterus was cleared of all clot and debris.  The hysterotomy was then closed with 0 monocryl in a running locked fashion.  Followed by 0 Monocryl in an imbricating fashion.  The hysterotomy was found to be hemostatic.  The peritoneum was closed with monocryl in a running fashion and the muscles were reapproximated with the same monocryl suture.  The fascia was closed with a 0 Vicryl suture in a continuous running fashion from each side and tied in the midline.  The subcutaneous tissue was irrigated and rendered hemostatic with cautery.  The subcutaneous layer was subsequently closed with plain gut in a continuous running fashion.  The skin was closed with 3-0 Monocryl in a running subcuticular fashion.  Sponge, lap and needle counts were correct. Honeycomb dressing was placed on the incision.   Tyler Robidoux K Taam-Akelman 01/14/19 6:59 PM

## 2019-01-14 NOTE — Anesthesia Postprocedure Evaluation (Signed)
Anesthesia Post Note  Patient: Kalan Rinn  Procedure(s) Performed: CESAREAN SECTION (N/A Abdomen)     Patient location during evaluation: PACU Anesthesia Type: Spinal Level of consciousness: oriented and awake and alert Pain management: pain level controlled Vital Signs Assessment: post-procedure vital signs reviewed and stable Respiratory status: spontaneous breathing, respiratory function stable and patient connected to nasal cannula oxygen Cardiovascular status: blood pressure returned to baseline and stable Postop Assessment: no headache, no backache and no apparent nausea or vomiting Anesthetic complications: no    Last Vitals:  Vitals:   01/14/19 1533 01/14/19 1601  BP: 139/80 138/88  Pulse: 63 76  Resp: 18   Temp: 36.4 C   SpO2: 95%     Last Pain:  Vitals:   01/14/19 1533  TempSrc: Oral  PainSc: 4    Pain Goal: Patients Stated Pain Goal: 2 (01/11/19 2012)                 Effie Berkshire

## 2019-01-14 NOTE — Progress Notes (Signed)
Postop/Mag check Cherice Glennie 33 y.o. [id number] POD#0 sp rCS at [redacted]w[redacted]d for superimposed preeclampsia by severe Bps  S - denies any concerns, denies HA/VC/RUQ pain/SOB. Some nausea but tolerating PO. Pain controlled.  O - Vitals:   01/14/19 1601 01/14/19 1700 01/14/19 1800 01/14/19 1900  BP: 138/88 (!) 146/88 (!) 144/92 (!) 149/90  Pulse: 76 75 67 74  Resp: 18 17 17 18   Temp:  97.6 F (36.4 C) (!) 97.5 F (36.4 C) 98.1 F (36.7 C)  TempSrc:  Oral Oral Oral  SpO2:  97% 100% 99%  Weight:      Height:       UOP 50-187cc/h Abdomen soft, appropriately tender. Fundus < umbilicus. Dressing in place, small areas of saturation otherwise dry.   A/P: Briggett Tuccillo 33 y.o. [id number] POD#0 sp rCS at [redacted]w[redacted]d for superimposed preeclampsia by severe Bps, on magnesium for 24h for seizure ppx, doing appropriately. Cont procardia 60 xl QD. CBC/CMP pending in am.  Tikesha Mort K Taam-Akelman 01/14/19 7:43 PM

## 2019-01-14 NOTE — Anesthesia Procedure Notes (Signed)
Spinal  Start time: 01/14/2019 12:59 PM End time: 01/14/2019 1:01 PM Staffing Anesthesiologist: Effie Berkshire, MD Performed: anesthesiologist  Preanesthetic Checklist Completed: patient identified, site marked, surgical consent, pre-op evaluation, timeout performed, IV checked, risks and benefits discussed and monitors and equipment checked Spinal Block Patient position: sitting Prep: site prepped and draped and DuraPrep Location: L3-4 Injection technique: single-shot Needle Needle type: Pencan  Needle gauge: 24 G Needle length: 10 cm Needle insertion depth: 10 cm Additional Notes Patient tolerated well. No immediate complications.

## 2019-01-14 NOTE — Anesthesia Preprocedure Evaluation (Addendum)
Anesthesia Evaluation  Patient identified by MRN, date of birth, ID band Patient awake    Reviewed: Allergy & Precautions, NPO status , Patient's Chart, lab work & pertinent test results  Airway Mallampati: I       Dental no notable dental hx.    Pulmonary neg pulmonary ROS,    Pulmonary exam normal        Cardiovascular hypertension, Normal cardiovascular exam     Neuro/Psych negative neurological ROS  negative psych ROS   GI/Hepatic negative GI ROS, Neg liver ROS,   Endo/Other  negative endocrine ROS  Renal/GU negative Renal ROS     Musculoskeletal   Abdominal   Peds  Hematology   Anesthesia Other Findings   Reproductive/Obstetrics (+) Pregnancy                            Anesthesia Physical Anesthesia Plan  ASA: II  Anesthesia Plan: Spinal   Post-op Pain Management:    Induction:   PONV Risk Score and Plan: 2 and Ondansetron and Treatment may vary due to age or medical condition  Airway Management Planned: Natural Airway  Additional Equipment: None  Intra-op Plan:   Post-operative Plan:   Informed Consent: I have reviewed the patients History and Physical, chart, labs and discussed the procedure including the risks, benefits and alternatives for the proposed anesthesia with the patient or authorized representative who has indicated his/her understanding and acceptance.       Plan Discussed with: CRNA  Anesthesia Plan Comments: (Lab Results      Component                Value               Date                      WBC                      13.1 (H)            01/14/2019                HGB                      14.1                01/14/2019                HCT                      40.2                01/14/2019                MCV                      92.0                01/14/2019                PLT                      187                 01/14/2019           )       Anesthesia Quick Evaluation

## 2019-01-14 NOTE — Transfer of Care (Signed)
Immediate Anesthesia Transfer of Care Note  Patient: Holly Mitchell  Procedure(s) Performed: CESAREAN SECTION (N/A Abdomen)  Patient Location: PACU  Anesthesia Type:Spinal  Level of Consciousness: awake, alert  and oriented  Airway & Oxygen Therapy: Patient Spontanous Breathing  Post-op Assessment: Report given to RN and Post -op Vital signs reviewed and stable  Post vital signs: Reviewed and stable  Last Vitals:  Vitals Value Taken Time  BP 137/89 01/14/19 1409  Temp    Pulse 64 01/14/19 1412  Resp 18 01/14/19 1412  SpO2 97 % 01/14/19 1412  Vitals shown include unvalidated device data.  Last Pain:  Vitals:   01/14/19 1130  TempSrc: Oral  PainSc:       Patients Stated Pain Goal: 2 (12/45/80 9983)  Complications: No apparent anesthesia complications

## 2019-01-14 NOTE — Progress Notes (Signed)
Holly Mitchell 33 y.o. G3P1011 at [redacted]w[redacted]d HD#5 admitted with [facility], concern for preeclampsia S: Denies HA/VC/RUQ pain/SOB. Reports normal FM O: Patient Vitals for the past 24 hrs:  BP Temp Temp src Pulse Resp SpO2  01/14/19 0755 (!) 159/107 - - 69 - -  01/14/19 0741 (!) 168/110 98.3 F (36.8 C) Oral 71 18 100 %  01/14/19 0739 (!) 160/108 - - 60 - -  01/14/19 0605 (!) 147/97 98 F (36.7 C) Oral 69 - 100 %  01/13/19 2307 133/73 - - 67 - 99 %  01/13/19 1928 (!) 155/90 98.1 F (36.7 C) Oral 73 - 99 %  01/13/19 1544 (!) 151/100 98.3 F (36.8 C) Oral 61 - 99 %  01/13/19 1359 (!) 148/91 - - 70 - -  01/13/19 1215 (!) 145/91 - - 81 - -  01/13/19 1200 (!) 161/95 - - 76 - 99 %  01/13/19 1145 (!) 164/88 - - 60 - 98 %   NST Cat 1 on 12/8.  Recent Labs    01/13/19 1659 01/14/19 0825  WBC 13.1* 13.1*  HGB 14.7 14.1  HCT 42.6 40.2  PLT 194 187  NA 134*  --   K 3.9  --   CL 104  --   BUN 10  --   CREATININE 0.46  --   AST 19  --   ALT 24  --   BILITOT 0.3  --    24h urine 170mg   A/p: Holly Mitchell 33 y.o. G3P1011 at [redacted]w[redacted]d Hd#5, plan for delivery today due to chronic HTN with superimposed preeclampsia 1.  CHTN/superimposed preeclampsia: on no meds, admitted 12/4 with severe range Bps, required IV Lab 20, 40 12/4, was then on procardia 10mg  TID IR, then on procardia 30mg  XL. Was stable until 12/8, required IV lab 20mg . This morning severe BP, repeat not severe, will increase to procardia 60mg  XL QD and plan to proceed with delivery today. Preeclampsia labs remain normal, CMP pending. Will determine if she needs mag for seizure ppx postpartum pending Bps. Did receive mag 12/4-5 2. Prematurity: S/p BMZ 12/4-5. Growth 2719g 50% 12/7.  3. Delivery: Pt is for repeat C/S 2/2 1 prior CS. BMI 32.6. Hgb 14.1, posterior fundal placenta.  Edilson Vital K Taam-Akelman 01/14/19 10:05 AM

## 2019-01-14 NOTE — Brief Op Note (Signed)
01/14/2019  2:10 PM  PATIENT:  Holly Mitchell  33 y.o. female  PRE-OPERATIVE DIAGNOSIS:  previous cesarean section, chronic hypertension with superimposed preeclampsia  POST-OPERATIVE DIAGNOSIS:  previous cesarean section, chronic hypertension with superimposed preeclampsia  PROCEDURE:  Procedure(s): CESAREAN SECTION (N/A)  SURGEON:  Surgeon(s) and Role:    * Olga Millers, MD - Primary    * Taam-Akelman, Lawrence Santiago, MD - Assisting   ANESTHESIA:   spinal  EBL:  208 mL   BLOOD ADMINISTERED:none  DRAINS: none   LOCAL MEDICATIONS USED:  NONE  SPECIMEN:  Source of Specimen:  placenta  DISPOSITION OF SPECIMEN:  PATHOLOGY  COUNTS:  YES  TOURNIQUET:  * No tourniquets in log *  DICTATION: .Note written in EPIC  PLAN OF CARE: Admit to inpatient   PATIENT DISPOSITION:  PACU - hemodynamically stable.   Delay start of Pharmacological VTE agent (>24hrs) due to surgical blood loss or risk of bleeding: yes

## 2019-01-15 LAB — COMPREHENSIVE METABOLIC PANEL
ALT: 19 U/L (ref 0–44)
AST: 21 U/L (ref 15–41)
Albumin: 2.7 g/dL — ABNORMAL LOW (ref 3.5–5.0)
Alkaline Phosphatase: 116 U/L (ref 38–126)
Anion gap: 9 (ref 5–15)
BUN: 5 mg/dL — ABNORMAL LOW (ref 6–20)
CO2: 24 mmol/L (ref 22–32)
Calcium: 7.4 mg/dL — ABNORMAL LOW (ref 8.9–10.3)
Chloride: 101 mmol/L (ref 98–111)
Creatinine, Ser: 0.51 mg/dL (ref 0.44–1.00)
GFR calc Af Amer: >60 mL/min (ref >60)
GFR calc non Af Amer: >60 mL/min (ref >60)
Glucose, Bld: 99 mg/dL (ref 70–99)
Potassium: 3.8 mmol/L (ref 3.5–5.1)
Sodium: 134 mmol/L — ABNORMAL LOW (ref 135–145)
Total Bilirubin: 0.1 mg/dL — ABNORMAL LOW (ref 0.3–1.2)
Total Protein: 5.4 g/dL — ABNORMAL LOW (ref 6.5–8.1)

## 2019-01-15 LAB — CBC
HCT: 38.3 % (ref 36.0–46.0)
Hemoglobin: 13.2 g/dL (ref 12.0–15.0)
MCH: 31.9 pg (ref 26.0–34.0)
MCHC: 34.5 g/dL (ref 30.0–36.0)
MCV: 92.5 fL (ref 80.0–100.0)
Platelets: 207 10*3/uL (ref 150–400)
RBC: 4.14 MIL/uL (ref 3.87–5.11)
RDW: 12.9 % (ref 11.5–15.5)
WBC: 15 10*3/uL — ABNORMAL HIGH (ref 4.0–10.5)
nRBC: 0 % (ref 0.0–0.2)

## 2019-01-15 MED ORDER — ENOXAPARIN SODIUM 40 MG/0.4ML ~~LOC~~ SOLN
40.0000 mg | SUBCUTANEOUS | Status: DC
  Administered 2019-01-15 – 2019-01-16 (×2): 40 mg via SUBCUTANEOUS
  Filled 2019-01-15 (×3): qty 0.4

## 2019-01-15 NOTE — Lactation Note (Signed)
This note was copied from a baby's chart. Lactation Consultation Note Baby 49 hrs old at time of consult. Mom hadn't supplemented only attempted to BF. Mom stated baby wasn't doing that well at the breast. Baby wasn't real hungry or aggressive at the breast. Mom has everted nipples, breast is heavy as if edema. Areolas semi compressible. Encouraged mom to hand express to soften breast tissue for easier latching. Mom BF her now 31 yr old son for 1 1/2 yrs. Mom shown how to use DEBP & how to disassemble, clean, & reassemble parts. Mom knows to pump q3h for 15-20 min. Mom pump seeing a dusting of colostrum on flange. Discussed w/mom the need for baby to be supplemented after BF. Mom in agreement. Discussed options of Donor milk or 22 cal. Similac. Mom stated as long as baby was fed, prefers Donor milk if available. Mom signed consent. Milk storage and preporation for Donor milk discussed. Mom states understanding. Gave baby 10 ml Donor milk w/yellow slow flow. Appeared to be to fast. Gave mom purple nipples for next feedings. LPI information sheet given and reviewed. Mom states understanding.  Information may need reviewed. Mom on Mag. And sleep at times. Baby has been gagging some prior to supplementing. Encouraged mom to hold baby up right for about 20 minutes. Answered questions. Encouraged to call for further questions, assistance or concerns. Lactation brochure given. Reported to Rn, explained Donor milk.  Patient Name: Holly Mitchell [id number] Date: 01/15/2019 Reason for consult: Initial assessment;Infant < 6lbs;Late-preterm 34-36.6wks   Maternal Data Has patient been taught Hand Expression?: Yes Does the patient have breastfeeding experience prior to this delivery?: Yes  Feeding Feeding Type: Donor Breast Milk Nipple Type: Extra Slow Flow  LATCH Score       Type of Nipple: Everted at rest and after stimulation  Comfort (Breast/Nipple): Soft / non-tender(edema to  breast)        Interventions Interventions: Breast feeding basics reviewed;Support pillows;Position options;Breast massage;Hand express;Breast compression;DEBP  Lactation Tools Discussed/Used Tools: Pump Breast pump type: Double-Electric Breast Pump Pump Review: Setup, frequency, and cleaning;Milk Storage Initiated by:: Allayne Stack RN IBCLC Date initiated:: 01/15/19   Consult Status Consult Status: Follow-up Date: 01/16/19 Follow-up type: In-patient    Jeury Mcnab, Elta Guadeloupe 01/15/2019, 5:59 AM

## 2019-01-15 NOTE — Lactation Note (Signed)
This note was copied from a baby's chart. Lactation Consultation Note  Patient Name: Girl Jhanae Jaskowiak OMVEH'M Date: 01/15/2019  Mom requesting more donor breastmilk.  Took mom two 25 ml of donor breastmilk.  RN had labels to scan into the [facility] for the donor breastmilk.  Mom reports she has not pumped because she is trying to wake her up about every hour to breastfeed. Asked mom if she had LPTI guidelines and she said somewhere.  Took parents two copies of LPTI guidelines.  Praised breastfeeding efforts, but encouraged mom to offer the breast based on her hunger cues and every 2-3 hours instead of trying to breastfeed her every hour.  Observed infant feeding.  Infant goes to the breast and fusses and pulls off.  Showed mom how to do massage/compression to keep the colosotrum flowing to help infant maintain.  Urged mom to start doing some pumping past breastfeedings and providing some of her own breastmilk to baby. Reviewed how long donor milk good for with mom.  Urged her to call Lactation as needed.   Maternal Data    Feeding Feeding Type: Breast Fed  [facility] Score                   Interventions    Lactation Tools Discussed/Used     Consult Status      Carmeron Heady Thompson Caul 01/15/2019, 8:41 PM

## 2019-01-15 NOTE — Progress Notes (Signed)
Subjective: Postpartum Day 1: Cesarean Delivery Patient reports pain controlled, no nausea or vomiting, tolerating po.  Objective: Vital signs in last 24 hours: Temp:  [97.5 F (36.4 C)-98.1 F (36.7 C)] 97.9 F (36.6 C) (12/10 1154) Pulse Rate:  [67-106] 73 (12/10 1154) Resp:  [17-18] 17 (12/10 1400) BP: (122-155)/(76-93) 154/93 (12/10 1154) SpO2:  [97 %-100 %] 99 % (12/10 1154)   Vitals:   01/15/19 0322 01/15/19 0737 01/15/19 1154 01/15/19 1400  BP: 122/76 132/88 (!) 154/93   Pulse: 69 80 73   Resp: 18 18 18 17   Temp: 97.9 F (36.6 C) 97.9 F (36.6 C) 97.9 F (36.6 C)   TempSrc: Oral Oral Oral   SpO2: 100% 100% 99%   Weight:      Height:         Physical Exam:  General: alert, cooperative and appears stated age 33: appropriate Uterine Fundus: firm Incision: healing well DVT Evaluation: No evidence of DVT seen on physical exam.  Recent Labs    01/14/19 0825 01/15/19 0532  HGB 14.1 13.2  HCT 40.2 38.3    Assessment/Plan: Status post Cesarean section. Doing well postoperatively.  Continue current care. Stop Mag 24 hours after delivery. Monitor BPs, Currently on procardia 60XL Given multiple minor risk factors for thromboembolism will start patient on prophylactic lovenox while in hospital  Vanessa Kick 01/15/2019, 3:34 PM

## 2019-01-15 NOTE — Lactation Note (Signed)
This note was copied from a baby's chart. Lactation Consultation Note  Patient Name: Holly Mitchell [id number] Date: 01/15/2019 Reason for consult: Follow-up assessment   Baby 75 hours old.  [redacted] weeks GA. Mother is experienced breastfeeding mother, very relaxed.  Mother has baby latched upon entering with intermittent swallows. Mother is supplementing with donor milk since baby has been sleepy. Reminded mother to post pump after every other feeding. Assisted w/ warming donor milk.     Maternal Data    Feeding Feeding Type: Breast Fed  LATCH Score Latch: Grasps breast easily, tongue down, lips flanged, rhythmical sucking.  Audible Swallowing: A few with stimulation  Type of Nipple: Everted at rest and after stimulation  Comfort (Breast/Nipple): Soft / non-tender  Hold (Positioning): No assistance needed to correctly position infant at breast.  LATCH Score: 9  Interventions Interventions: Breast feeding basics reviewed  Lactation Tools Discussed/Used     Consult Status Consult Status: Follow-up Date: 01/16/19 Follow-up type: In-patient    Vivianne Master [facility] 01/15/2019, 2:34 PM

## 2019-01-16 LAB — SURGICAL PATHOLOGY

## 2019-01-16 MED ORDER — ENALAPRIL MALEATE 5 MG PO TABS
5.0000 mg | ORAL_TABLET | Freq: Every day | ORAL | Status: DC
  Administered 2019-01-16 – 2019-01-17 (×2): 5 mg via ORAL
  Filled 2019-01-16 (×2): qty 1

## 2019-01-16 NOTE — Lactation Note (Signed)
This note was copied from a baby's chart. Lactation Consultation Note  Patient Name: Girl France Lusty LMBEM'L Date: 01/16/2019   Attempted to visit with mom but she was on the phone; didn't even acknowledge LC presence, no eye contact. LC told dad that lactation will visit at a later time.  Maternal Data    Feeding Feeding Type: Breast Fed Nipple Type: Extra Slow Flow  LATCH Score                   Interventions    Lactation Tools Discussed/Used     Consult Status      Adalee Kathan S Myrta Mercer 01/16/2019, 11:33 AM

## 2019-01-16 NOTE — Progress Notes (Signed)
CSW received consult for hx of Anxiety and Depression.  CSW met with MOB to offer support and complete assessment.    When CSW arrived to room 103 MOB was bonding with infant and FOB was on his phone and observing MOB's and infant's interactions. CSW explained CSW role and MOB gave CSW permission to assess MOB while FOB was present. The couple appeared supportive of one another and communicated having a good support team that consists of MOB and FOB family members.  MOB was easy to engage and  Forthcoming.  CSW asked about MOB's MH hx and MOB openly shared being dx with anxiety "About 3 years ago."  MOB reported that her dx was situational and was not open to sharing the situation.  Per MOB her symptoms has dissolved and MOB has had little to no symptoms in over 2 years.  CSW provided education regarding the baby blues period vs. perinatal mood disorders, discussed treatment and gave resources for mental health follow up if concerns arise.  CSW recommends self-evaluation during the postpartum time period using the New Mom Checklist from Postpartum Progress and encouraged MOB to contact a medical professional if symptoms are noted at any time.  MOB denied having PPD with MOB's oldest child and reported feeling comfortable seeking help if warranted.  CSW assessed for safety and MOB denied SI and HI.     CSW identifies no further need for intervention and no barriers to discharge at this time.  Dametria Tuzzolino Boyd-Gilyard, MSW, LCSW Clinical Social Work [phone]   

## 2019-01-16 NOTE — Lactation Note (Signed)
This note was copied from a baby's chart. Lactation Consultation Note  Patient Name: Holly Mitchell [id number] Date: 01/16/2019 Reason for consult: Follow-up assessment;Late-preterm 34-36.6wks;Infant < 6lbs;Infant weight loss  48 hours old LPI female< 6 lbs who is being exclusively BF by her mother, she's a P2 and experienced BF. She BF her first baby for 18 months and felt pretty confident about BF. However her first baby was born at full term and + 7 lbs; LC explained to mom the differences between LPI behavior and FT behavior. She hasn't been pumping at all, she's just been putting baby to breast and supplementing every so often (not consistently) with donor milk; baby is at 5% weight loss.  Offered assistance with latch but mom politely declined stating baby already fed, she was asleep on mother's arms. Asked mom to call for assistance when needed. Reviewed LPI behavior, cluster feeding, feeding cues, supplementation guidelines/amounts for LPIs, supply & demand and pumping schedule.   She now understands that most LPIs < 6 lbs are not going to be able to fully empty the breast and she'll need to continue pumping a few more weeks after leaving the hospital in order to protect her supply and prevent engorgement. Mom grateful for information provided, she said she'll start pumping and supplementing more consistently.  Feeding plan:  1. Encouraged mom to feed baby STS 8-12 times/24 hours or sooner if feeding cues are present 2. Urged her to pump every 3 hours after feedings and use any amount of EBM first prior using donor milk 3. She'll start supplementing baby more consistently with EBM/donor milk, every 3 hours after feedings at the breast, following LPI guidelines, at least 20-30 ml per feeding  Mom reported all questions and concerns were answered, she's aware of [facility] OP services and will call PRN.  Maternal Data    Feeding Feeding Type: Breast Fed Nipple Type: Extra Slow  Flow  LATCH Score                   Interventions Interventions: Breast feeding basics reviewed;DEBP  Lactation Tools Discussed/Used Tools: Pump Breast pump type: Double-Electric Breast Pump   Consult Status Consult Status: Follow-up Date: 01/17/19 Follow-up type: In-patient    Janyra Barillas Francene Boyers 01/16/2019, 1:56 PM

## 2019-01-16 NOTE — Progress Notes (Signed)
Subjective: Postpartum Day 2: Cesarean Delivery Patient reports pain controlled, no nausea or vomiting, tolerating po.  Objective: Vital signs in last 24 hours: Temp:  [98.4 F (36.9 C)-99.1 F (37.3 C)] 99.1 F (37.3 C) (12/11 1153) Pulse Rate:  [63-85] 85 (12/11 1153) Resp:  [16-18] 18 (12/11 1153) BP: (132-163)/(74-99) 136/77 (12/11 1153) SpO2:  [98 %-100 %] 100 % (12/11 1153)   Vitals:   01/15/19 2354 01/16/19 0453 01/16/19 0755 01/16/19 1153  BP: 140/74 132/77 (!) 150/90 136/77  Pulse: 76 68 63 85  Resp: 18 18 18 18   Temp: 98.5 F (36.9 C) 98.5 F (36.9 C) 98.4 F (36.9 C) 99.1 F (37.3 C)  TempSrc: Oral Oral Oral Oral  SpO2: 100% 100% 100% 100%  Weight:      Height:         Physical Exam:  General: alert, cooperative and appears stated age 33: appropriate Uterine Fundus: firm Incision: healing well DVT Evaluation: No evidence of DVT seen on physical exam.  Recent Labs    01/14/19 0825 01/15/19 0532  HGB 14.1 13.2  HCT 40.2 38.3    Assessment/Plan: Status post Cesarean section. Doing well postoperatively.   CHTN / SI preeclampsia:  S/p 24 hrs of magnesium sulfate prophylaxis.  Currently on procardia XL 60mg  daily.  BPs have been labile over last 24 hours.  Will add enalapril 5mg  daily for improved control and continue inpatient monitoring.    PPx: Lovenox   [place] 01/16/2019, 2:40 PM

## 2019-01-17 LAB — RPR: RPR Ser Ql: NONREACTIVE

## 2019-01-17 MED ORDER — NIFEDIPINE ER 60 MG PO TB24: 60.0000 mg | ORAL_TABLET | Freq: Every day | ORAL | 3 refills | Status: DC

## 2019-01-17 MED ORDER — ENALAPRIL MALEATE 5 MG PO TABS: 5.0000 mg | ORAL_TABLET | Freq: Every day | ORAL | 3 refills | Status: DC

## 2019-01-17 MED ORDER — IBUPROFEN 800 MG PO TABS: 800.0000 mg | ORAL_TABLET | Freq: Three times a day (TID) | ORAL | 0 refills | Status: AC

## 2019-01-17 NOTE — Progress Notes (Signed)
Subjective: Postpartum Day 2: Cesarean Delivery Patient reports pain controlled, no nausea or vomiting, tolerating po.  Enalapril 5mg  daily added yesterday and BPs were much improved--mainly 130s/80s.   She is still asymptomatic, without HA/BV/RUQ or epigastric pain  Objective: Vital signs in last 24 hours: Temp:  [98.2 F (36.8 C)-99.1 F (37.3 C)] 98.6 F (37 C) (12/12 0741) Pulse Rate:  [69-85] 73 (12/12 0741) Resp:  [18] 18 (12/12 0741) BP: (134-152)/(74-93) 152/93 (12/12 0741) SpO2:  [96 %-100 %] 96 % (12/12 0741)   Vitals:   01/16/19 1912 01/16/19 2305 01/17/19 0408 01/17/19 0741  BP: 134/88 139/82 (!) 150/89 (!) 152/93  Pulse: 83 75 69 73  Resp: 18 18 18 18   Temp: 98.3 F (36.8 C) 98.2 F (36.8 C) 98.3 F (36.8 C) 98.6 F (37 C)  TempSrc: Oral Oral Oral Oral  SpO2: 100% 99% 100% 96%  Weight:      Height:         Physical Exam:  General: alert, cooperative and appears stated age 33: appropriate Uterine Fundus: firm Incision: healing well DVT Evaluation: No evidence of DVT seen on physical exam. Ext: no edema, 2+ DTRs, no clonus  Recent Labs    01/15/19 0532  HGB 13.2  HCT 38.3    Assessment/Plan: [id number] POD#3 s/p RCS for severe superimposed preeclampsia.  CHTN / SI preeclampsia:  S/p 24 hrs of magnesium sulfate prophylaxis.BPs much improved with procardia XL 60mg  daily and enalapril 5mg  daily.  BPs were mostly mild range yesterday with addition of enalapril.  Although there were two 150s/90s this AM, most recent BP was 143/84.  Patient feels well and strongly desires discharge to home.  She will check her BP BID and will plan for discharge to home today with f/u BP in office in 3-4 days.  Warning signs reviewed   [place] 01/17/2019, 9:31 AM

## 2019-01-17 NOTE — Discharge Summary (Signed)
Obstetric Discharge Summary Reason for Admission: observation/evaluation.  Patient was admitted at [redacted]w[redacted]d with severe range BPs.  Ch has a h/o CHTN for which she was on no medication.  She was seen for a routine OB visit and noted to have severe range BPs 170s/100s.  She was admitted to antepartum for management.  She received BMZ, anti-hypertensive therapy, magesium sulfate.  Severe range BPs persisted, so on HD#6 she underwent RCS at [redacted]w[redacted]d for severe superimposed preeclampsia.   Postpartum course was routine.  She received 24 hours of magnesium sulfate.  PO anti-hypertensive therapy was titrated.  All labs remained normal.  On POD# 3  She was discharged to home on procardia XL 60mg  daily and enalapril 5mg  daily   Prenatal Procedures: NST and ultrasound Intrapartum Procedures: cesarean: low cervical, transverse Postpartum Procedures: none Complications-Operative and Postpartum: none Hemoglobin  Date Value Ref Range Status  01/15/2019 13.2 12.0 - 15.0 g/dL Final   HCT  Date Value Ref Range Status  01/15/2019 38.3 36.0 - 46.0 % Final    Physical Exam:  General: alert, cooperative and appears stated age 33: appropriate Uterine Fundus: firm Incision: healing well, no significant drainage DVT Evaluation: No evidence of DVT seen on physical exam.  Discharge Diagnoses: Preelampsia  Discharge Information: Date: 01/17/2019 Activity: pelvic rest Diet: routine Medications: PNV, Ibuprofen and procardia XL 60mg  daily and enalapril 5mg  daily Condition: stable Instructions: refer to practice specific booklet Discharge to: home Follow-up Information    Ob/Gyn, [facility] Follow up in 3 day(s).   Why: BP check 3-4 days Contact information: [address] [place] [id number] [phone]           Newborn Data: Live born female  Birth Weight: 5 lb 4.8 oz (2405 g) APGAR: ,   Newborn Delivery   Birth date/time: 01/14/2017 13:28:00 Delivery type: C-Section, Low  Transverse Trial of labor: No C-section categorization: Repeat      Home with mother.  [place] 01/17/2019, 1:21 PM

## 2019-01-17 NOTE — Lactation Note (Addendum)
This note was copied from a baby's chart. Lactation Consultation Note  Patient Name: Holly Mitchell Date: 01/17/2019   Infant is 68 hrs old. Mom feels that infant is doing very well at the breast. She feels that her milk came to volume yesterday. She pumped once yesterday & was able to pump 35 mL (30 mL from the unfed breast & 5 ml from the breast recently fed from). Mom had an abundant supply with her 1st child.   Since infant is doing well & infant will likely be on an upward weight gain trajectory tomorrow, I've encouraged Mom to pump a few times/day (infant only lost 73 g despite having had 6 voids & 3 stools during the time period between being weighed). Mom has a Motif Luna at home. Parents were shown how to assemble & use hand pump (single- & double-mode) that was included in pump kit.   Mom is comfortable with pumping & latching. I  discussed that LPIs can start to become fatigued a few days after birth & may not want to nurse as well. Mom stated she felt she observed that yesterday & she made a point of giving donor milk to help infant's energy reserves. I discussed how if she were to see that again, she could give EBM to ensure proper intake.  Mom is on enalapril (L2) and nifedipine.   Matthias Hughs [facility] 01/17/2019, 9:12 AM

## 2019-02-11 ENCOUNTER — Inpatient Hospital Stay (HOSPITAL_COMMUNITY): Admit: 2019-02-11 | Payer: BC Managed Care – PPO | Admitting: Obstetrics

## 2021-07-10 IMAGING — US US MFM OB COMP +14 WKS
1 series · 13 of 28 positions shown · non-contrast
Comparison: none

[Series 1: us mfm ob comp +14 wks · 79 acquisitions, 13 frames shown]
[im 3/79]
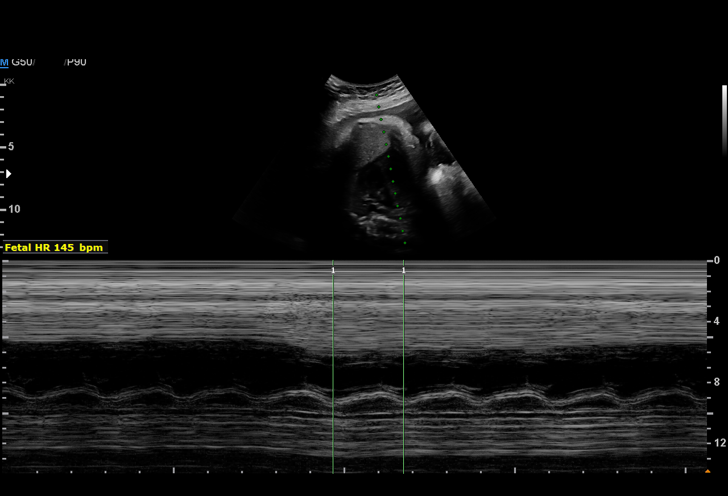
[im 9/79]
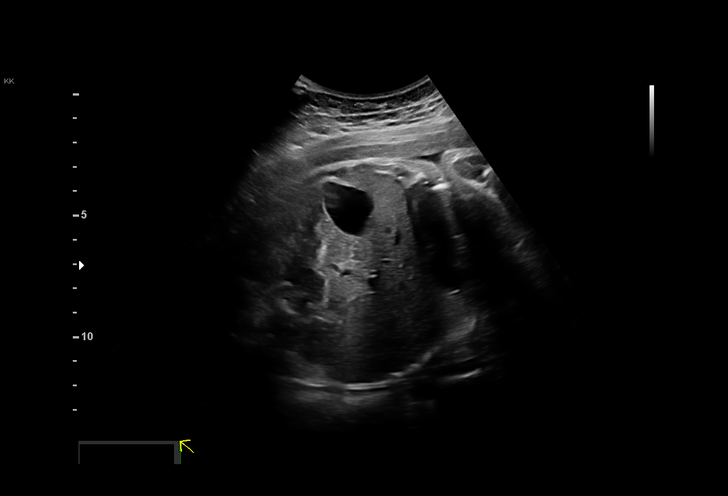
[im 15/79]
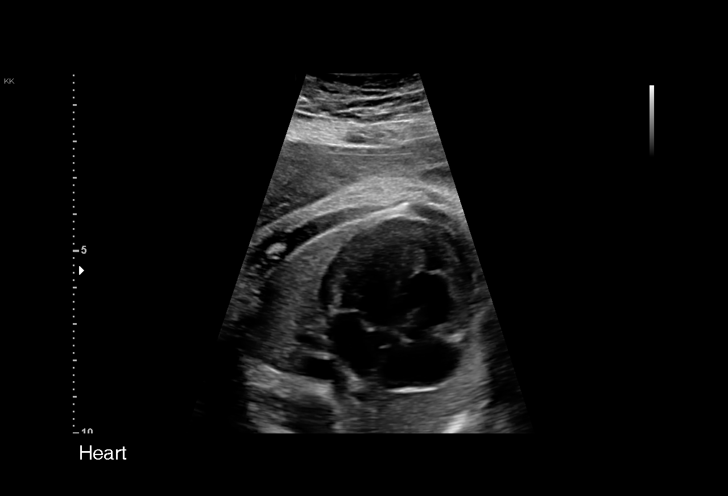
[im 21/79]
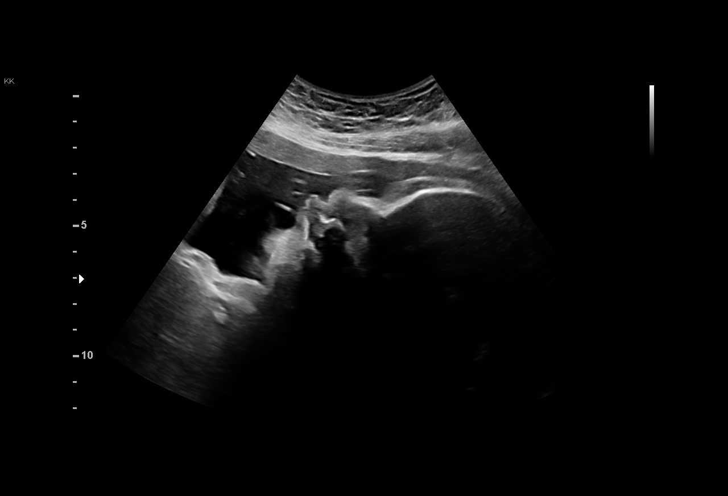
[im 27/79]
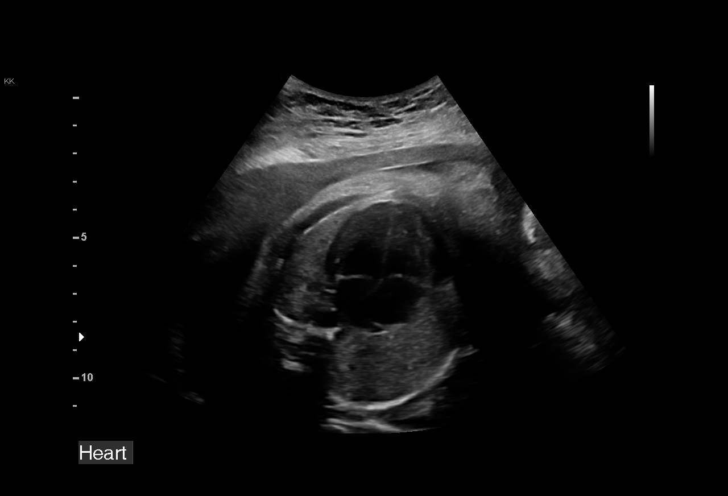
[im 32/79]
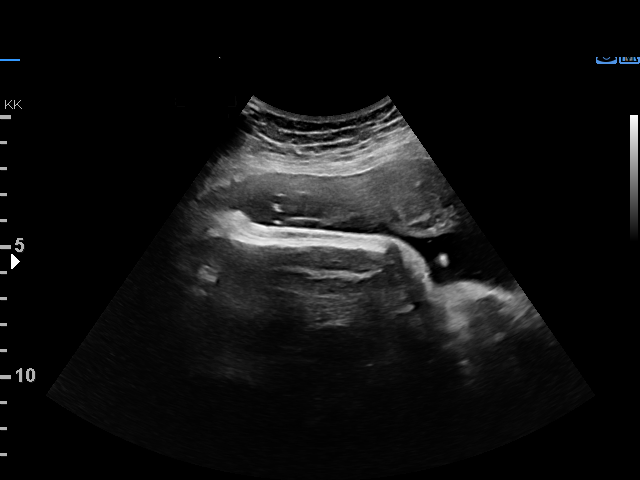
[im 41/79]
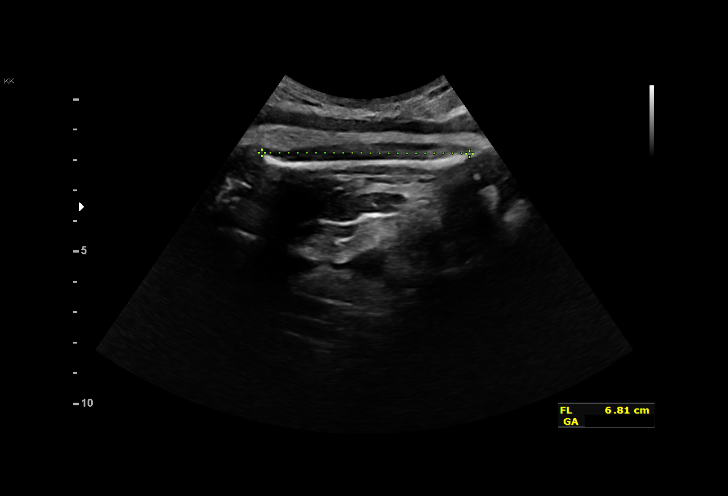
[im 47/79]
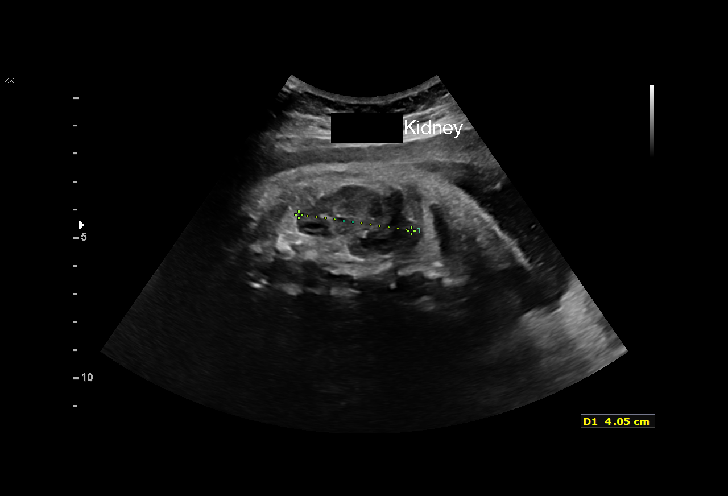
[im 53/79]
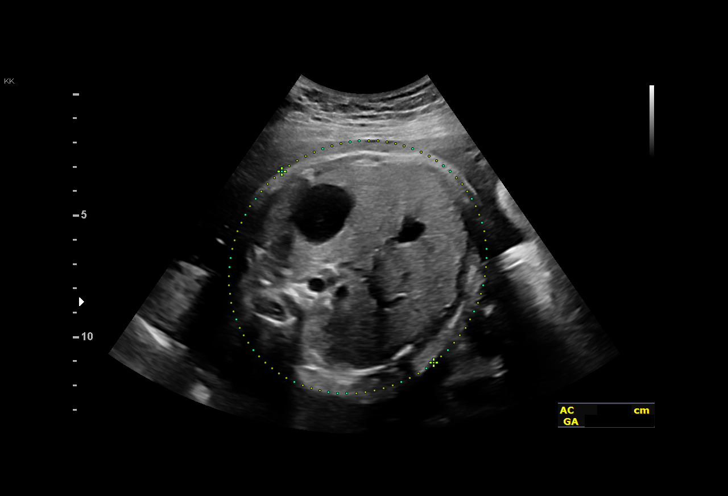
[im 58/79]
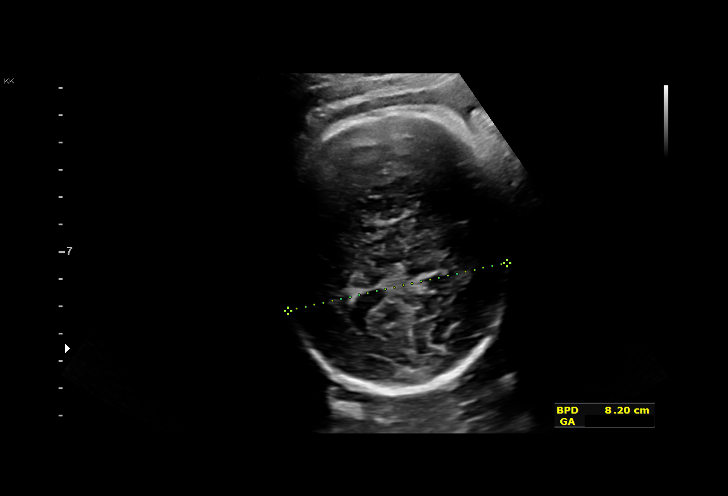
[im 64/79]
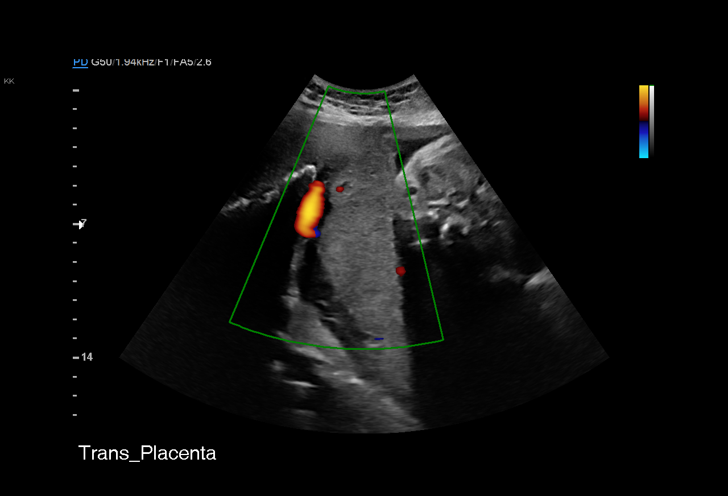
[im 70/79]
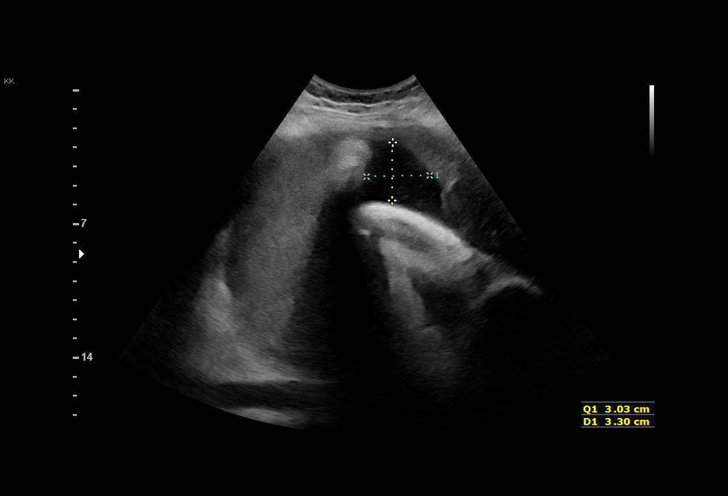
[im 76/79]
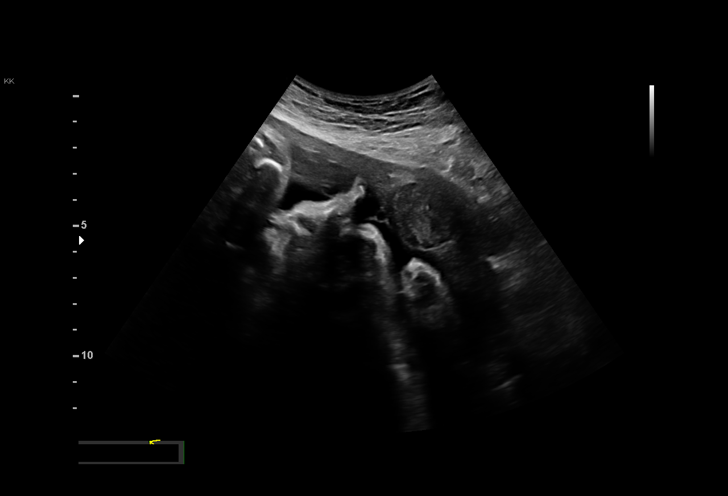

[13 of 28 positions shown; findings below may reference images not displayed]

Care
 Referred By:      NONY               [facility]
                   [facility]                                [HOSPITAL]

  1  US MFM OB COMP + 14 WK               76805.01     GUADA HICKERSON
 ----------------------------------------------------------------------

 ----------------------------------------------------------------------
Indications

  Hypertension - Chronic/Pre-existing (No
  Meds)
  History of cesarean delivery, currently
  pregnant
  Uterine fibroids
  35 weeks gestation of pregnancy
 ----------------------------------------------------------------------
Vital Signs

 BMI:
Fetal Evaluation

 Num Of Fetuses:          1
 Fetal Heart Rate(bpm):   145
 Cardiac Activity:        Observed
 Presentation:            Cephalic
 Placenta:                Posterior Fundal
 P. Cord Insertion:       Not well visualized

 Amniotic Fluid
 AFI FV:      Within normal limits

 AFI Sum(cm)     %Tile       Largest Pocket(cm)
 15.15           55

 RUQ(cm)       RLQ(cm)       LUQ(cm)        LLQ(cm)

Biophysical Evaluation

 Amniotic F.V:   Pocket => 2 cm             F. Tone:         Observed
 F. Movement:    Observed                   Score:           [DATE]
 F. Breathing:   Observed
Biometry

 BPD:      82.6  mm     G. Age:  33w 2d          5  %    CI:        73.63   %    70 - 86
                                                         FL/HC:       22.1  %    20.1 -
 HC:      305.8  mm     G. Age:  34w 0d          2  %    HC/AC:       0.93       0.93 -
 AC:      329.1  mm     G. Age:  36w 6d         88  %    FL/BPD:      82.0  %    71 - 87
 FL:       67.7  mm     G. Age:  34w 6d         25  %    FL/AC:       20.6  %    20 - 24

 Est. FW:    5922   gm          6 lb     50  %
OB History

 Gravidity:    3         Term:   1        Prem:   0        SAB:   0
 TOP:          1       Ectopic:  0        Living: 1
Gestational Age

 Clinical EDD:  35w 4d                                        EDD:   02/12/19
 U/S Today:     34w 5d                                        EDD:   02/18/19
 Best:          35w 4d     Det. By:  Clinical EDD             EDD:   02/12/19
Anatomy

 Cranium:               Appears normal         LVOT:                   Not well visualized
 Cavum:                 Not well visualized    Aortic Arch:            Not well visualized
 Ventricles:            Not well visualized    Ductal Arch:            Not well visualized
 Choroid Plexus:        Not well visualized    Diaphragm:              Appears normal
 Cerebellum:            Not well visualized    Stomach:                Appears normal, left
                                                                       sided
 Posterior Fossa:       Not well visualized    Abdomen:                Appears normal
 Nuchal Fold:           Not applicable (>20    Abdominal Wall:         Not well visualized
                        wks GA)
 Face:                  Appears normal         Cord Vessels:           Appears normal (3
                        (orbits and profile)                           vessel cord)
 Lips:                  Appears normal         Kidneys:                Appear normal
 Palate:                Not well visualized    Bladder:                Appears normal
 Thoracic:              Appears normal         Spine:                  Not well visualized
 Heart:                 Appears normal         Upper Extremities:      Not well visualized
                        (4CH, axis, and
                        situs)
 RVOT:                  Not well visualized    Lower Extremities:      Not well visualized

 Other:  Technically difficult due to advanced GA and fetal position.
Cervix Uterus Adnexa

 Cervix
 Not visualized (advanced GA >08wks)
Myomas

  Site                     L(cm)      W(cm)      D(cm)       Location
  Anterior                 4
 ----------------------------------------------------------------------
  Blood Flow                 RI        PI       Comments

 ----------------------------------------------------------------------
Comments

 This patient was seen for a follow up growth scan due to
 hypertension in pregnancy. She is currently being observed
 in the hospital due to her elevated blood pressures.
 The fetal growth and amniotic fluid level appears appropriate
 for her gestational age.
 A BPP performed today was [DATE].

## 2023-07-31 ENCOUNTER — Encounter (HOSPITAL_BASED_OUTPATIENT_CLINIC_OR_DEPARTMENT_OTHER): Payer: Self-pay

## 2023-07-31 ENCOUNTER — Other Ambulatory Visit: Payer: Self-pay

## 2023-07-31 ENCOUNTER — Emergency Department (HOSPITAL_BASED_OUTPATIENT_CLINIC_OR_DEPARTMENT_OTHER)
Admission: EM | Admit: 2023-07-31 | Discharge: 2023-07-31 | Disposition: A | Discharge status: Home / Self Care | Attending: Emergency Medicine | Admitting: Emergency Medicine

## 2023-07-31 ENCOUNTER — Emergency Department (HOSPITAL_BASED_OUTPATIENT_CLINIC_OR_DEPARTMENT_OTHER)

## 2023-07-31 DIAGNOSIS — R9431 Abnormal electrocardiogram [ECG] [EKG]: Secondary | ICD-10-CM | POA: Diagnosis not present

## 2023-07-31 DIAGNOSIS — I10 Essential (primary) hypertension: Secondary | ICD-10-CM | POA: Diagnosis not present

## 2023-07-31 DIAGNOSIS — R079 Chest pain, unspecified: Secondary | ICD-10-CM | POA: Diagnosis present

## 2023-07-31 DIAGNOSIS — Z79899 Other long term (current) drug therapy: Secondary | ICD-10-CM | POA: Insufficient documentation

## 2023-07-31 DIAGNOSIS — I1A0 Resistant hypertension: Secondary | ICD-10-CM | POA: Diagnosis not present

## 2023-07-31 DIAGNOSIS — D649 Anemia, unspecified: Secondary | ICD-10-CM | POA: Insufficient documentation

## 2023-07-31 LAB — BASIC METABOLIC PANEL WITH GFR
Anion gap: 12 (ref 5–15)
BUN: 13 mg/dL (ref 6–20)
CO2: 22 mmol/L (ref 22–32)
Calcium: 10.1 mg/dL (ref 8.9–10.3)
Chloride: 103 mmol/L (ref 98–111)
Creatinine, Ser: 0.56 mg/dL (ref 0.44–1.00)
GFR, Estimated: >60 mL/min (ref >60)
Glucose, Bld: 88 mg/dL (ref 70–99)
Potassium: 4.2 mmol/L (ref 3.5–5.1)
Sodium: 137 mmol/L (ref 135–145)

## 2023-07-31 LAB — TROPONIN T, HIGH SENSITIVITY
Troponin T High Sensitivity: 28 ng/L — ABNORMAL HIGH (ref <19)
Troponin T High Sensitivity: 30 ng/L — ABNORMAL HIGH (ref <19)

## 2023-07-31 LAB — CBC
HCT: 28.3 % — ABNORMAL LOW (ref 36.0–46.0)
Hemoglobin: 8.1 g/dL — ABNORMAL LOW (ref 12.0–15.0)
MCH: 20.6 pg — ABNORMAL LOW (ref 26.0–34.0)
MCHC: 28.6 g/dL — ABNORMAL LOW (ref 30.0–36.0)
MCV: 72 fL — ABNORMAL LOW (ref 80.0–100.0)
Platelets: 528 10*3/uL — ABNORMAL HIGH (ref 150–400)
RBC: 3.93 MIL/uL (ref 3.87–5.11)
RDW: 23.2 % — ABNORMAL HIGH (ref 11.5–15.5)
WBC: 8.4 10*3/uL (ref 4.0–10.5)
nRBC: 0 % (ref 0.0–0.2)

## 2023-07-31 LAB — HCG, SERUM, QUALITATIVE: Preg, Serum: NEGATIVE

## 2023-07-31 MED ORDER — FERROUS SULFATE 325 (65 FE) MG PO TABS: 325.0000 mg | ORAL_TABLET | Freq: Every day | ORAL | 3 refills | Status: AC

## 2023-07-31 MED ORDER — LISINOPRIL 10 MG PO TABS
10.0000 mg | ORAL_TABLET | Freq: Once | ORAL | Status: AC
  Administered 2023-07-31: 10 mg via ORAL
  Filled 2023-07-31: qty 1

## 2023-07-31 MED ORDER — LISINOPRIL 10 MG PO TABS: 10.0000 mg | ORAL_TABLET | Freq: Every day | ORAL | 3 refills | Status: DC

## 2023-07-31 NOTE — ED Triage Notes (Addendum)
 Patient arrives POV with complaints of elevated blood pressure and Abnormal ECG. Patient reports that she went to Urgent Care for left hand pain and she was noted to have an elevated BP. UC did an ECG that was abnormal. Sent here for further evaluation due to having chest pain/SOB this week.   Patient reports that she was on BP meds in the past and developed side effects, and she stopped taking all medications.

## 2023-07-31 NOTE — Discharge Instructions (Addendum)
 You were seen in the emergency department for evaluation of elevated blood pressure and abnormal EKG.  Your cardiac markers were elevated but did not show signs of heart attack.  You are also very anemic.  We are starting you on some iron tablets for the anemia and some blood pressure medication.  It will be important for you to establish care with a primary care doctor.  We are putting in for referral for cardiology for you.  They should call you to set up an appointment.  Return to the emergency department if any worsening or concerning symptoms

## 2023-07-31 NOTE — ED Provider Notes (Addendum)
  Physical Exam  BP (!) 189/108   Pulse 69   Temp 98.2 F (36.8 C)   Resp 14   Ht 5' 4 (1.626 m)   Wt 90.7 kg   LMP 07/20/2023 (Approximate)   SpO2 100%   BMI 34.33 kg/m   Physical Exam  Procedures  Procedures  ED Course / MDM    Medical Decision Making Amount and/or Complexity of Data Reviewed Labs: ordered. Radiology: ordered.  Risk OTC drugs. Prescription drug management.   6F presenting to the ED with hypertension from urgent care. Has LVH on EKG, first troponin mildly elevated. Not currently on antihypertensives.    Repeat cardiac enzyme is flat.  Patient without current chest pain, no vision changes.  Offered admission for consideration for hypertensive emergency however the patient was adamantly against admission at this time, would prefer to start antihypertensives and follow-up with a primary care provider.  I counseled the patient on return precautions.  On review of the labs, the patient also was found to be anemic, she does endorse heavy menstrual cycles.  She endorses fatigue, shortness of breath and lightheadedness occasionally.  I advised that the patient follow-up closely with a primary care provider for recheck of her hemoglobin, return to the emergency department for any severe worsening symptoms.  Will discharge on lisinopril and iron supplementation.  Questions answered at time of discharge.    Jerrol Agent, MD 07/31/23 1533    Jerrol Agent, MD 07/31/23 [phone]

## 2023-07-31 NOTE — ED Provider Notes (Signed)
 [place] [facility] Provider Note   CSN: [id number] Arrival date & time: 07/31/23  1300     Patient presents with: Hypertension and Abnormal ECG   Holly Mitchell is a 38 y.o. female.  She went to urgent care today for some left hand pain and they found her blood pressure to be elevated and EKG abnormal.  She said she had a history of blood pressure a few years ago and they had her on blood pressure medicines but it was causing her side effects so she stopped taking it.  Does not routinely see a doctor so has not checked her blood pressure since then.  Her left hand has been sore since last night worse with movement.  She is right-hand dominant and she does not recall any trauma.  She said the doctor there thought it might be tendinitis.  No chest pain or shortness of breath currently but did have chest pain a few days ago.  She said she gets on and off chest pain.  As far as her anemia she said she has been told she is anemic and iron deficient but she has not been taking her iron.   The history is provided by the patient.  Hypertension This is a recurrent problem. Associated symptoms include chest pain. Pertinent negatives include no abdominal pain, no headaches and no shortness of breath. Nothing aggravates the symptoms. Nothing relieves the symptoms. She has tried nothing for the symptoms. The treatment provided no relief.       Prior to Admission medications   Medication Sig Start Date End Date Taking? Authorizing Provider  acetaminophen  (TYLENOL ) 500 MG tablet Take 500 mg by mouth every 6 (six) hours as needed for mild pain or headache.    [provider]  calcium carbonate (TUMS - DOSED IN MG ELEMENTAL CALCIUM) 500 MG chewable tablet Chew 1 tablet by mouth as needed for indigestion or heartburn.    [provider]  enalapril  (VASOTEC ) 5 MG tablet Take 1 tablet (5 mg total) by mouth daily. 01/18/19   Gretta Gums, MD  ibuprofen   (ADVIL ) 800 MG tablet Take 1 tablet (800 mg total) by mouth every 8 (eight) hours. 01/17/19   Gretta Gums, MD  NIFEdipine  (ADALAT  CC) 60 MG 24 hr tablet Take 1 tablet (60 mg total) by mouth daily. 01/18/19   Gretta Gums, MD  Prenatal Vit-Fe Fumarate-FA (PRENATAL MULTIVITAMIN) TABS tablet Take 1 tablet by mouth daily at 12 noon.    [provider]    Allergies: Patient has no known allergies.    Review of Systems  Constitutional:  Negative for fever.  HENT:  Negative for sore throat.   Respiratory:  Negative for shortness of breath.   Cardiovascular:  Positive for chest pain.  Gastrointestinal:  Negative for abdominal pain.  Genitourinary:  Negative for dysuria.  Musculoskeletal:  Negative for neck pain.  Skin:  Negative for rash.  Neurological:  Negative for headaches.    Updated Vital Signs BP (!) 184/105   Pulse 66   Temp 98.2 F (36.8 C)   Resp 18   Ht 5' 4 (1.626 m)   Wt 90.7 kg   LMP 07/20/2023 (Approximate)   SpO2 100%   BMI 34.33 kg/m   Physical Exam Vitals and nursing note reviewed.  Constitutional:      General: She is not in acute distress.    Appearance: Normal appearance. She is well-developed.  HENT:     Head: Normocephalic and  atraumatic.   Eyes:     Conjunctiva/sclera: Conjunctivae normal.    Cardiovascular:     Rate and Rhythm: Normal rate and regular rhythm.     Heart sounds: No murmur heard. Pulmonary:     Effort: Pulmonary effort is normal. No respiratory distress.     Breath sounds: Normal breath sounds. No stridor. No wheezing.  Abdominal:     Palpations: Abdomen is soft.     Tenderness: There is no abdominal tenderness. There is no guarding or rebound.   Musculoskeletal:        General: No tenderness or deformity. Normal range of motion.     Cervical back: Neck supple.   Skin:    General: Skin is warm and dry.   Neurological:     General: No focal deficit present.     Mental Status: She is alert.     GCS: GCS eye  subscore is 4. GCS verbal subscore is 5. GCS motor subscore is 6.     Sensory: No sensory deficit.     Motor: No weakness.     (all labs ordered are listed, but only abnormal results are displayed) Labs Reviewed  CBC - Abnormal; Notable for the following components:      Result Value   Hemoglobin 8.1 (*)    HCT 28.3 (*)    MCV 72.0 (*)    MCH 20.6 (*)    MCHC 28.6 (*)    RDW 23.2 (*)    Platelets 528 (*)    All other components within normal limits  TROPONIN T, HIGH SENSITIVITY - Abnormal; Notable for the following components:   Troponin T High Sensitivity 28 (*)    All other components within normal limits  TROPONIN T, HIGH SENSITIVITY - Abnormal; Notable for the following components:   Troponin T High Sensitivity 30 (*)    All other components within normal limits  BASIC METABOLIC PANEL WITH GFR  HCG, SERUM, QUALITATIVE    EKG: EKG Interpretation Date/Time:  Wednesday July 31 2023 13:13:06 EDT Ventricular Rate:  65 PR Interval:  121 QRS Duration:  104 QT Interval:  409 QTC Calculation: 426 R Axis:   35  Text Interpretation: Sinus rhythm Probable LVH with secondary repol abnrm No old tracing to compare Confirmed by Towana Sharper [phone]) on 07/31/2023 1:14:06 PM  Radiology: DG Chest 2 View Result Date: 07/31/2023 CLINICAL DATA:  Chest pain EXAM: CHEST - 2 VIEW COMPARISON:  None Available. FINDINGS: No consolidation, pneumothorax or effusion. No edema. Normal cardiopericardial silhouette. Curvature of the spine. IMPRESSION: No acute cardiopulmonary disease. Electronically Signed   By: Ranell Bring M.D.   On: 07/31/2023 14:06     Procedures   Medications Ordered in the ED  lisinopril (ZESTRIL) tablet 10 mg (10 mg Oral Given 07/31/23 1428)                                    Medical Decision Making Amount and/or Complexity of Data Reviewed Labs: ordered. Radiology: ordered.  Risk OTC drugs. Prescription drug management.   This patient complains of elevated  blood pressure abnormal EKG; this involves an extensive number of treatment Options and is a complaint that carries with it a high risk of complications and morbidity. The differential includes hypertension, LVH, ischemia, hypertensive urgency  I ordered, reviewed and interpreted labs, which included CBC with low hemoglobin, chemistries normal, troponins mildly elevated, pregnancy negative I ordered medication  oral blood pressure medicine and reviewed PMP when indicated. I ordered imaging studies which included chest x-ray and I independently    visualized and interpreted imaging which showed no acute findings Additional history obtained from patient's significant other Previous records obtained and reviewed in epic including urgent care visit today  Cardiac monitoring reviewed, sinus rhythm Social determinants considered, no significant barriers Critical Interventions: None  After the interventions stated above, I reevaluated the patient and found patient to be fairly asymptomatic Admission and further testing considered, her care is signed out to Dr. Jerrol to follow-up on delta troponin.  If not significantly rising and blood pressure is a little better controlled she likely can be discharged on blood pressure medicine with a cardiology follow-up.      Final diagnoses:  Resistant hypertension  Abnormal ECG  Anemia, unspecified type    ED Discharge Orders          Ordered    lisinopril (ZESTRIL) 10 MG tablet  Daily        07/31/23 1506    ferrous sulfate 325 (65 FE) MG tablet  Daily        07/31/23 1507    Ambulatory referral to Cardiology       Comments: If you have not heard from the Cardiology office within the next 72 hours please call [phone].   07/31/23 1508               Towana Ozell BROCKS, MD 07/31/23 1735

## 2023-08-15 ENCOUNTER — Telehealth: Payer: Self-pay

## 2023-08-15 NOTE — Telephone Encounter (Signed)
 Called patient to schedule new patient appointment. Left voicemail with our contact information to call back and schedule.

## 2023-08-16 ENCOUNTER — Other Ambulatory Visit (HOSPITAL_BASED_OUTPATIENT_CLINIC_OR_DEPARTMENT_OTHER): Payer: Self-pay | Admitting: Physician Assistant

## 2023-08-16 DIAGNOSIS — R93429 Abnormal radiologic findings on diagnostic imaging of unspecified kidney: Secondary | ICD-10-CM

## 2023-08-19 DIAGNOSIS — F320 Major depressive disorder, single episode, mild: Secondary | ICD-10-CM | POA: Insufficient documentation

## 2023-08-20 ENCOUNTER — Encounter: Payer: Self-pay | Admitting: Cardiology

## 2023-08-20 ENCOUNTER — Ambulatory Visit: Attending: Cardiology | Admitting: Cardiology

## 2023-08-20 VITALS — BP 162/100 | HR 62 | Ht 64.0 in | Wt 185.4 lb

## 2023-08-20 DIAGNOSIS — R5383 Other fatigue: Secondary | ICD-10-CM | POA: Insufficient documentation

## 2023-08-20 DIAGNOSIS — I10 Essential (primary) hypertension: Secondary | ICD-10-CM | POA: Diagnosis not present

## 2023-08-20 DIAGNOSIS — Z13220 Encounter for screening for lipoid disorders: Secondary | ICD-10-CM | POA: Insufficient documentation

## 2023-08-20 DIAGNOSIS — Z8759 Personal history of other complications of pregnancy, childbirth and the puerperium: Secondary | ICD-10-CM | POA: Diagnosis not present

## 2023-08-20 DIAGNOSIS — O133 Gestational [pregnancy-induced] hypertension without significant proteinuria, third trimester: Secondary | ICD-10-CM | POA: Diagnosis present

## 2023-08-20 DIAGNOSIS — R0609 Other forms of dyspnea: Secondary | ICD-10-CM | POA: Diagnosis not present

## 2023-08-20 NOTE — Progress Notes (Signed)
 Cardiology Consultation:    Date:  08/20/2023   ID:  Holly Mitchell, DOB 05-Apr-1985, MRN [id number]  PCP:  Caleen Repine, PA-C  Cardiologist:  Lamar Fitch, MD   Referring MD: Towana Ozell BROCKS, MD   Chief Complaint  Patient presents with   Hypertension    History of Present Illness:    Holly Mitchell is a 38 y.o. female who is being seen today for the evaluation of hypertension at the request of Towana Ozell BROCKS, MD. she past medical history significant for preeclampsia with both of her children.  That led to high blood pressure but during the first pregnancy only last 2 weeks of pregnancy were with high blood pressure and preeclampsia after that complete recovery, second pregnancy after that she continues to have high blood pressure her second child is 11 years old.  Recently she ended going to the emergency room because of her blood pressure was very high she also have some weakness on the left hand.  Troponins were minimally elevated and flat.  She initiated blood pressure medications since that time she seems to be doing fine.  She is active as she is trying to exercise with her daughter.  Denies have any exertional symptoms no chest pain tightness squeezing pressure burning chest.  Obviously concerned about her blood pressure.  She had difficulty sleeping she had difficulty falling asleep she does see some therapist for that.  Apparently does not snore.  Does have family history of essential hypertension.  History reviewed. No pertinent past medical history.  Past Surgical History:  Procedure Laterality Date   CESAREAN SECTION N/A 01/14/2019   Procedure: CESAREAN SECTION;  Surgeon: Lenon Oneil BRAVO, MD;  Location: [facility] LD ORS;  Service: Obstetrics;  Laterality: N/A;    Current Medications: Current Meds  Medication Sig   acetaminophen  (TYLENOL ) 500 MG tablet Take 500 mg by mouth every 6 (six) hours as needed for mild pain or headache.   calcium carbonate (TUMS - DOSED IN  MG ELEMENTAL CALCIUM) 500 MG chewable tablet Chew 1 tablet by mouth as needed for indigestion or heartburn.   ferrous sulfate  325 (65 FE) MG tablet Take 1 tablet (325 mg total) by mouth daily.   ibuprofen  (ADVIL ) 800 MG tablet Take 1 tablet (800 mg total) by mouth every 8 (eight) hours.   losartan-hydrochlorothiazide (HYZAAR) 50-12.5 MG tablet Take 1 tablet by mouth daily.   [DISCONTINUED] enalapril  (VASOTEC ) 5 MG tablet Take 1 tablet (5 mg total) by mouth daily.   [DISCONTINUED] lisinopril  (ZESTRIL ) 10 MG tablet Take 1 tablet (10 mg total) by mouth daily.   [DISCONTINUED] NIFEdipine  (ADALAT  CC) 60 MG 24 hr tablet Take 1 tablet (60 mg total) by mouth daily.   [DISCONTINUED] Prenatal Vit-Fe Fumarate-FA (PRENATAL MULTIVITAMIN) TABS tablet Take 1 tablet by mouth daily at 12 noon.     Allergies:   Patient has no known allergies.   Social History   Socioeconomic History   Marital status: Single    Spouse name: Not on file   Number of children: Not on file   Years of education: Not on file   Highest education level: Not on file  Occupational History   Not on file  Tobacco Use   Smoking status: Never   Smokeless tobacco: Current  Vaping Use   Vaping status: Every Day  Substance and Sexual Activity   Alcohol use: Yes    Alcohol/week: 7.0 standard drinks of alcohol    Types: 7 Cans of beer per week  Drug use: Never   Sexual activity: Yes  Other Topics Concern   Not on file  Social History Narrative   Not on file   Social Drivers of Health   Financial Resource Strain: Not on file  Food Insecurity: Not on file  Transportation Needs: Not on file  Physical Activity: Not on file  Stress: Not on file  Social Connections: Not on file     Family History: The patient's family history is not on file. ROS:   Please see the history of present illness.    All 14 point review of systems negative except as described per history of present illness.  EKGs/Labs/Other Studies Reviewed:     The following studies were reviewed today:   EKG:  EKG Interpretation Date/Time:  Tuesday August 20 2023 09:29:04 EDT Ventricular Rate:  66 PR Interval:  132 QRS Duration:  100 QT Interval:  424 QTC Calculation: 444 R Axis:   17  Text Interpretation: Normal sinus rhythm Left ventricular hypertrophy with repolarization abnormality Abnormal ECG When compared with ECG of 31-Jul-2023 13:13, PREVIOUS ECG IS PRESENT Confirmed by Bernie Charleston [phone]) on 08/20/2023 9:38:36 AM    Recent Labs: 07/31/2023: BUN 13; Creatinine, Ser 0.56; Hemoglobin 8.1; Platelets 528; Potassium 4.2; Sodium 137  Recent Lipid Panel No results found for: CHOL, TRIG, HDL, CHOLHDL, VLDL, LDLCALC, LDLDIRECT  Physical Exam:    VS:  BP (!) 162/100 (BP Location: Left Arm, Patient Position: Sitting)   Pulse 62   Ht 5' 4 (1.626 m)   Wt 185 lb 6.4 oz (84.1 kg)   LMP 07/20/2023 (Approximate)   SpO2 98%   BMI 31.82 kg/m     Wt Readings from Last 3 Encounters:  08/20/23 185 lb 6.4 oz (84.1 kg)  07/31/23 200 lb (90.7 kg)  01/09/19 190 lb (86.2 kg)     GEN:  Well nourished, well developed in no acute distress HEENT: Normal NECK: No JVD; No carotid bruits LYMPHATICS: No lymphadenopathy CARDIAC: RRR, no murmurs, no rubs, no gallops RESPIRATORY:  Clear to auscultation without rales, wheezing or rhonchi  ABDOMEN: Soft, non-tender, non-distended MUSCULOSKELETAL:  No edema; No deformity  SKIN: Warm and dry NEUROLOGIC:  Alert and oriented x 3 PSYCHIATRIC:  Normal affect   ASSESSMENT:    1. Hypertension, unspecified type   2. Essential hypertension   3. Pregnancy-induced hypertension in third trimester   4. Dyspnea on exertion    PLAN:    In order of problems listed above:  Hypertension.  Blood pressure elevated today but this is first visit in my office.  She does have evidence of LVH on the EKG.  Will schedule the echocardiogram to assess left ventricular ejection fraction and look for  degree of left ventricle hypertrophy, in the meantime we will check her Chem-7 today, ask her to get blood pressure monitor and check blood pressure on the regular basis.  Bring her results to me about week or 2.  Anticipate need to adjust her medication which probably will be simply increasing dose of losartan hydrochlorothiazide.  She does have some kidney study done from 2018 which shows some abnormality.  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 Dyspnea exertion, echocardiogram will be done to assess left ventricle ejection fraction. Weakness on the left hand.  Short lasting.  Questionable potential TIA.  But I suspect it may have been related to high blood pressure.  Key for now will be to control her blood pressure better. Dyslipidemia: Unknown.  Will do fasting lipid profile   Medication Adjustments/Labs and Tests Ordered: Current medicines are reviewed at length with the patient today.  Concerns regarding medicines are outlined above.  Orders Placed This Encounter  Procedures   EKG 12-Lead   No orders of the defined types were placed in this encounter.   Signed, Lamar DOROTHA Fitch, MD, [facility]. 08/20/2023 9:57 AM    [facility]

## 2023-08-20 NOTE — Patient Instructions (Addendum)
 Medication Instructions:  Your physician recommends that you continue on your current medications as directed. Please refer to the Current Medication list given to you today.  *If you need a refill on your cardiac medications before your next appointment, please call your pharmacy*   Lab Work: CMP, TSH, Lipid- today If you have labs (blood work) drawn today and your tests are completely normal, you will receive your results only by: MyChart Message (if you have MyChart) OR A paper copy in the mail If you have any lab test that is abnormal or we need to change your treatment, we will call you to review the results.   Testing/Procedures: Your physician has requested that you have an echocardiogram. Echocardiography is a painless test that uses sound waves to create images of your heart. It provides your doctor with information about the size and shape of your heart and how well your heart's chambers and valves are working. This procedure takes approximately one hour. There are no restrictions for this procedure. Please do NOT wear cologne, perfume, aftershave, or lotions (deodorant is allowed). Please arrive 15 minutes prior to your appointment time.  Please note: We ask at that you not bring children with you during ultrasound (echo/ vascular) testing. Due to room size and safety concerns, children are not allowed in the ultrasound rooms during exams. Our front office staff cannot provide observation of children in our lobby area while testing is being conducted. An adult accompanying a patient to their appointment will only be allowed in the ultrasound room at the discretion of the ultrasound technician under special circumstances. We apologize for any inconvenience.   Your physician has requested that you have a renal artery duplex. During this test, an ultrasound is used to evaluate blood flow to the kidneys. Allow one hour for this exam. Do not eat after midnight the day before and avoid  carbonated beverages. Take your medications as you usually do.    Follow-Up: At [facility], you and your health needs are our priority.  As part of our continuing mission to provide you with exceptional heart care, we have created designated Provider Care Teams.  These Care Teams include your primary Cardiologist (physician) and Advanced Practice Providers (APPs -  Physician Assistants and Nurse Practitioners) who all work together to provide you with the care you need, when you need it.  We recommend signing up for the patient portal called MyChart.  Sign up information is provided on this After Visit Summary.  MyChart is used to connect with patients for Virtual Visits (Telemedicine).  Patients are able to view lab/test results, encounter notes, upcoming appointments, etc.  Non-urgent messages can be sent to your provider as well.   To learn more about what you can do with MyChart, go to [email or web address].    Your next appointment:   1 month(s)  The format for your next appointment:   In Person  Provider:   Lamar Fitch, MD    Other Instructions NA

## 2023-08-20 NOTE — Addendum Note (Signed)
 Addended by: ARLOA PLANAS D on: 08/20/2023 10:11 AM   Modules accepted: Orders

## 2023-08-21 LAB — COMPREHENSIVE METABOLIC PANEL WITH GFR
ALT: 17 IU/L (ref 0–32)
AST: 17 IU/L (ref 0–40)
Albumin: 4.6 g/dL (ref 3.9–4.9)
Alkaline Phosphatase: 97 IU/L (ref 44–121)
BUN/Creatinine Ratio: 30 — ABNORMAL HIGH (ref 9–23)
BUN: 16 mg/dL (ref 6–20)
Bilirubin Total: 0.2 mg/dL (ref 0.0–1.2)
CO2: 20 mmol/L (ref 20–29)
Calcium: 10.2 mg/dL (ref 8.7–10.2)
Chloride: 103 mmol/L (ref 96–106)
Creatinine, Ser: 0.54 mg/dL — ABNORMAL LOW (ref 0.57–1.00)
Globulin, Total: 2.6 g/dL (ref 1.5–4.5)
Glucose: 88 mg/dL (ref 70–99)
Potassium: 4.1 mmol/L (ref 3.5–5.2)
Sodium: 138 mmol/L (ref 134–144)
Total Protein: 7.2 g/dL (ref 6.0–8.5)
eGFR: 122 mL/min/1.73 (ref >59)

## 2023-08-21 LAB — LIPID PANEL
Chol/HDL Ratio: 3.7 ratio (ref 0.0–4.4)
Cholesterol, Total: 170 mg/dL (ref 100–199)
HDL: 46 mg/dL (ref >39)
LDL Chol Calc (NIH): 108 mg/dL — ABNORMAL HIGH (ref 0–99)
Triglycerides: 84 mg/dL (ref 0–149)
VLDL Cholesterol Cal: 16 mg/dL (ref 5–40)

## 2023-08-21 LAB — TSH: TSH: 2.12 u[IU]/mL (ref 0.450–4.500)

## 2023-08-23 ENCOUNTER — Telehealth: Payer: Self-pay

## 2023-08-23 ENCOUNTER — Ambulatory Visit: Payer: Self-pay | Admitting: Cardiology

## 2023-08-23 NOTE — Telephone Encounter (Signed)
 Left message on My Chart with lab results per Dr. Vanetta Shawl note. Routed to PCP.

## 2023-08-26 NOTE — Progress Notes (Signed)
 Subjective   Holly Mitchell is a 38 year old female who presents today for  Chief Complaint  Patient presents with  . Follow Up    Pt is here for a follow-up. Pt has no concerns.    Pt seen for first time by this provider on 08/13/23 for CPE. BP 160/80. Pt to stop lisinopril  and start losartan-hctz. Hgb 8.9 on exam - pt to continue iron supplement. F/u 2 weeks for BP. Pt never got labs done.   Pt seen by cardio on 08/20/23 with plan for echo to assess LV fxn. Her echo on 09/17/23. Her doctor knows she is on the hctz-losartan.  She has been taking her BP med everyday.  She had just started her period the Monday before out first appt. Her periods are usually 7 days but she is still on her period. She has been bleeding for 15 days. This week her bleeding has been very light. The week of her period was like her regular period in terms of bleeding. She has not heard from GYN yet.   She got labs done at her cardio appt: Cr 0.54, LDL 108, TSH 2.120.   She has been dealing with iron def since high school. She does have a tendency to have heavy periods which is likely what is causing hers. She does take a daily iron supplement.   She does get dizzy a little bit mainly before bed. Never had a syncopal episode.    Documentation Reviewed by Any User: Allergies  Meds  Surg Hx     Current Outpatient Medications  Medication Sig Dispense Refill  . losartan-hydrochlorothiazide (HYZAAR) 100-12.5 mg per tablet Take 1 Tablet by mouth once daily for 90 days. 90 Tablet 0  . ferrous sulfate  324 mg (65 mg iron) TbEC Take 324 mg by mouth daily.     Review of Systems  Constitutional:  Negative for diaphoresis.  Respiratory:  Negative for cough and shortness of breath.   Cardiovascular:  Negative for chest pain, palpitations and leg swelling.  Genitourinary:  Positive for menstrual problem (menorrhagia, prolonged).  Neurological:  Positive for dizziness and light-headedness. Negative for syncope, weakness and  headaches.  All other systems reviewed and are negative.    Objective  Recent Results (from the past 24 hours)  HCG URINE (POCT) Urine Routine   Collection Time: 08/27/23  9:12 AM   Specimen: Urine  Result Value Ref Range   HCG URINE NEGATIVE NEGATIVE   INTERNAL CONTROL PASS PASS     BP (!) 153/98 (Left Arm, Sitting, Regular Adult)  Pulse 54  Temp 97.4 F (36.3 C)  Ht 5' 4 (1.626 m)  Wt 185 lb 3.2 oz (84 kg)  LMP 08/13/2023  SpO2 100%  BMI 31.79 kg/m  OB Status Having periods  Smoking Status Never  BSA 1.95 m  Physical Exam Vitals and nursing note reviewed.   Constitutional:      General: She is not in acute distress.    Appearance: Normal appearance. She is not ill-appearing. She has obesity  Cardiovascular:     Rate and Rhythm: Normal rate and regular rhythm.  Pulmonary:     Effort: Pulmonary effort is normal.     Breath sounds: Normal breath sounds. No wheezing, rhonchi or rales.  Musculoskeletal:     Right lower leg: No edema.     Left lower leg: No edema.  Neurological:     General: No focal deficit present.     Mental Status: She is alert  and oriented to person, place, and time.  Psychiatric:        Mood and Affect: Mood normal.        Behavior: Behavior normal.     Assessment and Plan   1. Follow-up exam  2. Essential hypertension - losartan-hydrochlorothiazide (HYZAAR) 100-12.5 mg per tablet; Take 1 Tablet by mouth once daily for 90 days.  Dispense: 90 Tablet; Refill: 0  3. Anemia, unspecified type - FE+CBC/D/PLT/TIBC/FER/RETIC Routine - VITAMIN B12 & FOLATE Routine  4. Screening for diabetes mellitus - HEMOGLOBIN GLYCOSYLATED A1C Routine  5. Need for hepatitis C screening test - HEPATITIS C ANTIBODY WITH REFLEX TO HCV, RNA, QUANTITATIVE, REAL-TIME PCR (REFL) Routine  6. Screening for HIV (human immunodeficiency virus) - HIV 1/0/2 AG/AB W/CASCADE RFLX SUPPLEMENTAL TESTING Routine  7. Abnormal menses - HCG URINE (POCT) Urine  Routine - FE+CBC/D/PLT/TIBC/FER/RETIC Routine  Pt [facility] or [facility] on Friday for remainder of labs since there is no coverage today Prolonged menses likely result of IDA - UPT neg in office today Will increase losartan-hctz dose and f/u in 1 month - pt given BP log to bring back

## 2023-08-27 NOTE — Unmapped External Note (Signed)
 [address]   Understanding High Blood Pressure   High blood pressure or hypertension is known as a silent killer. This is because it's a serious health problem, but often doesnt cause symptoms. Many people dont know they have it until it leads to other health problems.   Normal blood pressure is less than 120/80 mmHg. If youre above this level for several readings over time, youll be diagnosed with high blood pressure. Healthy changes can help you lower your blood pressure. But once youre diagnosed, you'll need to manage it for the rest of your life.   What is blood pressure?   Your heart pumps blood into the blood vessels that carry it through your body. With each heartbeat the heart pushes blood through blood vessels called arteries. Blood pressure is a measure of how hard the blood pushes against the walls of the arteries as it flows.       How high blood pressure harms your health   In a healthy artery, the blood moves smoothly and puts normal pressure on its walls.      High blood pressure means the blood is pushing too hard against artery walls. This damages the walls. The walls form scar tissue as they heal. But the scar tissue makes the arteries stiff and weak. A fatty substance called plaque sticks to the scar tissue. This makes arteries narrower and harder.   High blood pressure:   Causes your heart to work harder to get blood around your body   Raises your risk for heart attack, heart failure, and stroke   Can lead to kidney disease and blindness       Check blood pressure   It's important to know your blood pressure numbers. A blood pressure reading is given as 2 numbers, such as 120/70. The top number is the pressure of blood against the artery walls during a heartbeat. That's calledsystolic. The bottom number is the pressure of blood against artery walls between heartbeats. That's called diastolic.   Blood pressure may be:   Normal: systolic lower than 120 and diastolic lower than 80   Elevated:  Systolic of 120-129 and diastolic less than 80   High, Stage 1: Systolic of 130-139 or diastolic of 80-89   High, Stage 2: Systolic of 140 or higher or diastolic of 90 and higher   For most people with high blood pressure, keeping readings under 130/80 mmHg may help prevent health problems. Talk with your healthcare provider. Find out what your blood pressure goals should be. Tell them what concerns you have about your readings.       Control high blood pressure   If your blood pressure is high, work with your healthcare provider to lower it. Lifestyle changes and possibly medicines may help.   Below are changes you can make to help lower your blood pressure:   Choose heart-healthy foods.  Ask your provider about the Dietary Approaches to Stop Hypertension (DASH) eating plan. DASH limits sodium (salt). It includes a lot of fruits and vegetables, low-fat or nonfat dairy foods, whole grains, and other foods high in fiber and low in fat. This plan also includes more dietary potassium. This can help lower blood pressure.   Cut sodium. Eating less sodium reduces fluid retention. This is when your body holds on to too much water. Having too much salt increases blood volume and blood pressure. The American Heart Association says the ideal limit is no more than 1,500 mg of sodium a day, especially  if you have high blood pressure. But because people in the [place] eat so much salt, they say cutting back to even 2,300 mg a day can help. Having only 1,000 mg of sodium a day can help your blood pressure and heart.   Stay at a healthy weight. Being overweight makes you more likely to have high blood pressure. Losing excess weight helps lower blood pressure.   Exercise regularly. Daily exercise helps your heart and blood vessels work better and stay healthier. It can help lower your blood pressure.   Don't smoke. Smoking raises blood pressure. And it damages blood vessels.   Limit alcohol. Drinking too much alcohol can raise blood  pressure. Men should have no more than 2 drinks a day. Women should have no more than 1 a day. A drink is equal to 1 beer, a small glass of wine, or a shot of liquor.   Control stress. Stress makes your heart work harder and beat faster. Managing stress in a healthy way helps you control your blood pressure.       Facts about high blood pressure   High blood pressure is often a lifelong problem.  But it can be controlled with lifestyle changes and medicine.   Blood pressure medicines need to be taken every day.  Stopping suddenly may cause a dangerous increase in pressure.   Medicine is only one part of controlling high blood pressure.  You also need to manage your weight, get regular exercise, and change your eating habits.   Hypertension isn't the same as stress. Stress may be a factor in high blood pressure, but its only one factor.   Feeling [place] doesn't mean your blood pressure is under control.  And feeling bad doesnt mean its out of control. The only way to know for sure is to check your pressure regularly.     Last Reviewed Date: 06/06/2022 00:00:00    2000-2025 [employer], [place]. All rights reserved. This information is not intended as a substitute for professional medical care. Always follow your healthcare professional's instructions.

## 2023-09-04 NOTE — Telephone Encounter (Signed)
 Left message to return call or view MyChart

## 2023-09-04 NOTE — Telephone Encounter (Signed)
-----   Message from Lamar Fitch sent at 08/23/2023 12:29 PM EDT ----- Labs are showing mild elevation of cholesterol we will talk about this during the visit nothing dramatic ----- Message ----- From: Interface, [facility] Lab Results In Sent: 08/21/2023   5:37 AM EDT To: Lamar JINNY Fitch, MD

## 2023-09-05 ENCOUNTER — Telehealth: Payer: Self-pay

## 2023-09-05 NOTE — Telephone Encounter (Signed)
 Pt viewed lab results on My Chart per Dr. Vanetta Shawl note. Routed to PCP.

## 2023-09-12 ENCOUNTER — Ambulatory Visit (HOSPITAL_BASED_OUTPATIENT_CLINIC_OR_DEPARTMENT_OTHER)
Admission: RE | Admit: 2023-09-12 | Discharge: 2023-09-12 | Disposition: A | Discharge status: Home / Self Care | Source: Ambulatory Visit | Attending: Cardiology | Admitting: Cardiology

## 2023-09-12 DIAGNOSIS — I10 Essential (primary) hypertension: Secondary | ICD-10-CM | POA: Insufficient documentation

## 2023-09-17 ENCOUNTER — Ambulatory Visit (HOSPITAL_BASED_OUTPATIENT_CLINIC_OR_DEPARTMENT_OTHER)
Admission: RE | Admit: 2023-09-17 | Discharge: 2023-09-17 | Disposition: A | Discharge status: Home / Self Care | Source: Ambulatory Visit | Attending: Cardiology | Admitting: Cardiology

## 2023-09-17 DIAGNOSIS — R0609 Other forms of dyspnea: Secondary | ICD-10-CM | POA: Diagnosis not present

## 2023-09-17 LAB — ECHOCARDIOGRAM COMPLETE
AR max vel: 2.39 cm2
AV Area VTI: 2.68 cm2
AV Area mean vel: 2.58 cm2
AV Mean grad: 5 mmHg
AV Peak grad: 10.9 mmHg
Ao pk vel: 1.65 m/s
Area-P 1/2: 2.28 cm2
Calc EF: 71.7 %
MV M vel: 4.52 m/s
MV Peak grad: 81.7 mmHg
S' Lateral: 2.6 cm
Single Plane A2C EF: 72.2 %
Single Plane A4C EF: 73.7 %

## 2023-10-09 ENCOUNTER — Ambulatory Visit: Attending: Cardiology | Admitting: Cardiology

## 2023-10-09 ENCOUNTER — Encounter: Payer: Self-pay | Admitting: Cardiology

## 2023-10-09 VITALS — BP 140/90 | HR 89 | Ht 64.0 in | Wt 182.0 lb

## 2023-10-09 DIAGNOSIS — R0609 Other forms of dyspnea: Secondary | ICD-10-CM | POA: Insufficient documentation

## 2023-10-09 DIAGNOSIS — I10 Essential (primary) hypertension: Secondary | ICD-10-CM | POA: Insufficient documentation

## 2023-10-09 MED ORDER — AMLODIPINE BESYLATE 5 MG PO TABS: 5.0000 mg | ORAL_TABLET | Freq: Every day | ORAL | 3 refills | Status: AC

## 2023-10-09 NOTE — Addendum Note (Signed)
 Addended by: ARLOA POTASH R on: 10/09/2023 11:42 AM   Modules accepted: Orders

## 2023-10-09 NOTE — Progress Notes (Signed)
 Cardiology Office Note:    Date:  10/09/2023   ID:  Dena Leech, DOB 12/03/1985, MRN [id number]  PCP:  Caleen Repine, PA-C  Cardiologist:  Lamar Fitch, MD    Referring MD: Caleen Repine, PA-C   Chief Complaint  Patient presents with   Follow-up    History of Present Illness:    Holly Mitchell is a 38 y.o. female comes today 2 months for follow-up.  Overall she is doing fine.  Her dose of Hyzaar has been increased blood pressure is better but still not well-controlled.  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  No chest pain tightness squeezing pressure burning chest  History reviewed. No pertinent past medical history.  Past Surgical History:  Procedure Laterality Date   CESAREAN SECTION N/A 01/14/2019   Procedure: CESAREAN SECTION;  Surgeon: Lenon Oneil BRAVO, MD;  Location: [facility] LD ORS;  Service: Obstetrics;  Laterality: N/A;    Current Medications: Current Meds  Medication Sig   acetaminophen  (TYLENOL ) 500 MG tablet Take 500 mg by mouth every 6 (six) hours as needed for mild pain or headache.   ferrous sulfate  325 (65 FE) MG tablet Take 1 tablet (325 mg total) by mouth daily.   ibuprofen  (ADVIL ) 800 MG tablet Take 1 tablet (800 mg total) by mouth every 8 (eight) hours.   losartan-hydrochlorothiazide (HYZAAR) 100-12.5 MG tablet Take 1 tablet by mouth daily.     Allergies:   Patient has no known allergies.   Social History   Socioeconomic History   Marital status: Single    Spouse name: Not on file   Number of children: Not on file   Years of education: Not on file   Highest education level: Not on file  Occupational History   Not on file  Tobacco Use   Smoking status: Never   Smokeless tobacco: Current  Vaping Use   Vaping status: Every Day  Substance and Sexual Activity   Alcohol use: Yes     Alcohol/week: 7.0 standard drinks of alcohol    Types: 7 Cans of beer per week   Drug use: Never   Sexual activity: Yes  Other Topics Concern   Not on file  Social History Narrative   Not on file   Social Drivers of Health   Financial Resource Strain: Not on file  Food Insecurity: Not at Risk (08/20/2023)   Received from [facility] Insecurity    Within the past 12 months, you worried that your food would run out before you got money to buy more.: 1  Transportation Needs: Not at Risk (08/20/2023)   Received from [employer] Needs    In the past 12 months, has lack of transportation kept you from medical appointments, meetings, work or from getting things needed for daily living?: 1  Physical Activity: Not on file  Stress: Not on file  Social Connections: Not on file     Family History: The patient's family history is not on file. ROS:   Please see the history of present illness.    All 14 point review of systems negative except as described per history of present illness  EKGs/Labs/Other Studies Reviewed:         Recent Labs: 07/31/2023: Hemoglobin 8.1; Platelets 528 08/20/2023: ALT 17; BUN 16; Creatinine, Ser 0.54; Potassium 4.1; Sodium 138; TSH 2.120  Recent Lipid  Panel    Component Value Date/Time   CHOL 170 08/20/2023 1010   TRIG 84 08/20/2023 1010   HDL 46 08/20/2023 1010   CHOLHDL 3.7 08/20/2023 1010   LDLCALC 108 (H) 08/20/2023 1010    Physical Exam:    VS:  BP (!) 140/90   Pulse 89   Ht 5' 4 (1.626 m)   Wt 182 lb (82.6 kg)   SpO2 99%   BMI 31.24 kg/m     Wt Readings from Last 3 Encounters:  10/09/23 182 lb (82.6 kg)  08/20/23 185 lb 6.4 oz (84.1 kg)  07/31/23 200 lb (90.7 kg)     GEN:  Well nourished, well developed in no acute distress HEENT: Normal NECK: No JVD; No carotid bruits LYMPHATICS: No lymphadenopathy CARDIAC: RRR, no murmurs, no rubs, no gallops RESPIRATORY:  Clear to auscultation without rales, wheezing or rhonchi   ABDOMEN: Soft, non-tender, non-distended MUSCULOSKELETAL:  No edema; No deformity  SKIN: Warm and dry LOWER EXTREMITIES: no swelling NEUROLOGIC:  Alert and oriented x 3 PSYCHIATRIC:  Normal affect   ASSESSMENT:    1. Essential hypertension   2. Dyspnea on exertion    PLAN:    In order of problems listed above:  Essential hypertension still not well-controlled.  Will increase her therapy I will add amlodipine  5 mg daily. Single kidney.  Will check Chem-7 since recently losartan has been increased. Dyspnea on exertion echocardiogram showed normal left ventricle ejection fraction will continue monitoring and make sure her blood pressure is well-controlled   Medication Adjustments/Labs and Tests Ordered: Current medicines are reviewed at length with the patient today.  Concerns regarding medicines are outlined above.  No orders of the defined types were placed in this encounter.  Medication changes: No orders of the defined types were placed in this encounter.   Signed, Lamar DOROTHA Fitch, MD, [facility] 10/09/2023 11:33 AM    [facility]

## 2023-10-09 NOTE — Patient Instructions (Addendum)
 Medication Instructions:  Your physician has recommended you make the following change in your medication:  Start Amlodipine  5 mg once daily  *If you need a refill on your cardiac medications before your next appointment, please call your pharmacy*  Lab Work: Your physician recommends that you return for lab work in: Next available day you can come from 8 to 11:30 and 1-3:45  If you have labs (blood work) drawn today and your tests are completely normal, you will receive your results only by: MyChart Message (if you have MyChart) OR A paper copy in the mail If you have any lab test that is abnormal or we need to change your treatment, we will call you to review the results.  Testing/Procedures: NONE  Follow-Up: At [facility], you and your health needs are our priority.  As part of our continuing mission to provide you with exceptional heart care, our providers are all part of one team.  This team includes your primary Cardiologist (physician) and Advanced Practice Providers or APPs (Physician Assistants and Nurse Practitioners) who all work together to provide you with the care you need, when you need it.  Your next appointment:   6 month(s)  Provider:   Lamar Fitch, MD    We recommend signing up for the patient portal called MyChart.  Sign up information is provided on this After Visit Summary.  MyChart is used to connect with patients for Virtual Visits (Telemedicine).  Patients are able to view lab/test results, encounter notes, upcoming appointments, etc.  Non-urgent messages can be sent to your provider as well.   To learn more about what you can do with MyChart, go to [email or web address].   Other Instructions
